# Patient Record
Sex: Female | Born: 1950
Health system: Southern US, Community
[De-identification: ages and names within clinical notes are randomized; demographics above are authoritative.]

## PROBLEM LIST (undated history)

## (undated) DIAGNOSIS — E119 Type 2 diabetes mellitus without complications: Secondary | ICD-10-CM

## (undated) DIAGNOSIS — K219 Gastro-esophageal reflux disease without esophagitis: Secondary | ICD-10-CM

## (undated) DIAGNOSIS — G2581 Restless legs syndrome: Secondary | ICD-10-CM

## (undated) DIAGNOSIS — R531 Weakness: Secondary | ICD-10-CM

## (undated) DIAGNOSIS — F32A Depression, unspecified: Secondary | ICD-10-CM

## (undated) DIAGNOSIS — I10 Essential (primary) hypertension: Secondary | ICD-10-CM

## (undated) DIAGNOSIS — Z8489 Family history of other specified conditions: Secondary | ICD-10-CM

## (undated) DIAGNOSIS — E785 Hyperlipidemia, unspecified: Secondary | ICD-10-CM

## (undated) DIAGNOSIS — I639 Cerebral infarction, unspecified: Secondary | ICD-10-CM

## (undated) HISTORY — DX: Cerebral infarction, unspecified: I63.9

## (undated) HISTORY — PX: TONSILLECTOMY: SUR1361

## (undated) HISTORY — PX: OTHER SURGICAL HISTORY: SHX169

## (undated) HISTORY — DX: Type 2 diabetes mellitus without complications: E11.9

## (undated) HISTORY — PX: ABDOMINAL HYSTERECTOMY: SHX81

## (undated) HISTORY — DX: Depression, unspecified: F32.A

## (undated) HISTORY — DX: Gastro-esophageal reflux disease without esophagitis: K21.9

## (undated) HISTORY — DX: Hyperlipidemia, unspecified: E78.5

## (undated) HISTORY — PX: CARPAL TUNNEL RELEASE: SHX101

## (undated) HISTORY — DX: Weakness: R53.1

## (undated) HISTORY — PX: ACHILLES TENDON REPAIR: SUR1153

## (undated) HISTORY — DX: Restless legs syndrome: G25.81

## (undated) HISTORY — PX: CHOLECYSTECTOMY: SHX55

## (undated) HISTORY — DX: Essential (primary) hypertension: I10

---

## 2020-05-24 ENCOUNTER — Other Ambulatory Visit: Payer: Self-pay

## 2020-05-24 ENCOUNTER — Encounter: Payer: Self-pay | Admitting: Podiatry

## 2020-05-24 ENCOUNTER — Ambulatory Visit: Payer: Medicare HMO | Admitting: Podiatry

## 2020-05-24 ENCOUNTER — Ambulatory Visit (INDEPENDENT_AMBULATORY_CARE_PROVIDER_SITE_OTHER): Payer: Medicare HMO

## 2020-05-24 DIAGNOSIS — M79675 Pain in left toe(s): Secondary | ICD-10-CM | POA: Diagnosis not present

## 2020-05-24 DIAGNOSIS — M79674 Pain in right toe(s): Secondary | ICD-10-CM | POA: Diagnosis not present

## 2020-05-24 DIAGNOSIS — B351 Tinea unguium: Secondary | ICD-10-CM | POA: Diagnosis not present

## 2020-05-24 DIAGNOSIS — M6740 Ganglion, unspecified site: Secondary | ICD-10-CM

## 2020-05-24 DIAGNOSIS — E119 Type 2 diabetes mellitus without complications: Secondary | ICD-10-CM

## 2020-05-24 NOTE — Patient Instructions (Addendum)
Diabetes Mellitus and Foot Care Foot care is an important part of your health, especially when you have diabetes. Diabetes may cause you to have problems because of poor blood flow (circulation) to your feet and legs, which can cause your skin to:  Become thinner and drier.  Break more easily.  Heal more slowly.  Peel and crack. You may also have nerve damage (neuropathy) in your legs and feet, causing decreased feeling in them. This means that you may not notice minor injuries to your feet that could lead to more serious problems. Noticing and addressing any potential problems early is the best way to prevent future foot problems. How to care for your feet Foot hygiene  Wash your feet daily with warm water and mild soap. Do not use hot water. Then, pat your feet and the areas between your toes until they are completely dry. Do not soak your feet as this can dry your skin.  Trim your toenails straight across. Do not dig under them or around the cuticle. File the edges of your nails with an emery board or nail file.  Apply a moisturizing lotion or petroleum jelly to the skin on your feet and to dry, brittle toenails. Use lotion that does not contain alcohol and is unscented. Do not apply lotion between your toes. Shoes and socks  Wear clean socks or stockings every day. Make sure they are not too tight. Do not wear knee-high stockings since they may decrease blood flow to your legs.  Wear shoes that fit properly and have enough cushioning. Always look in your shoes before you put them on to be sure there are no objects inside.  To break in new shoes, wear them for just a few hours a day. This prevents injuries on your feet. Wounds, scrapes, corns, and calluses  Check your feet daily for blisters, cuts, bruises, sores, and redness. If you cannot see the bottom of your feet, use a mirror or ask someone for help.  Do not cut corns or calluses or try to remove them with medicine.  If you  find a minor scrape, cut, or break in the skin on your feet, keep it and the skin around it clean and dry. You may clean these areas with mild soap and water. Do not clean the area with peroxide, alcohol, or iodine.  If you have a wound, scrape, corn, or callus on your foot, look at it several times a day to make sure it is healing and not infected. Check for: ? Redness, swelling, or pain. ? Fluid or blood. ? Warmth. ? Pus or a bad smell. General instructions  Do not cross your legs. This may decrease blood flow to your feet.  Do not use heating pads or hot water bottles on your feet. They may burn your skin. If you have lost feeling in your feet or legs, you may not know this is happening until it is too late.  Protect your feet from hot and cold by wearing shoes, such as at the beach or on hot pavement.  Schedule a complete foot exam at least once a year (annually) or more often if you have foot problems. If you have foot problems, report any cuts, sores, or bruises to your health care provider immediately. Contact a health care provider if:  You have a medical condition that increases your risk of infection and you have any cuts, sores, or bruises on your feet.  You have an injury that is not   healing.  You have redness on your legs or feet.  You feel burning or tingling in your legs or feet.  You have pain or cramps in your legs and feet.  Your legs or feet are numb.  Your feet always feel cold.  You have pain around a toenail. Get help right away if:  You have a wound, scrape, corn, or callus on your foot and: ? You have pain, swelling, or redness that gets worse. ? You have fluid or blood coming from the wound, scrape, corn, or callus. ? Your wound, scrape, corn, or callus feels warm to the touch. ? You have pus or a bad smell coming from the wound, scrape, corn, or callus. ? You have a fever. ? You have a red line going up your leg. Summary  Check your feet every day  for cuts, sores, red spots, swelling, and blisters.  Moisturize feet and legs daily.  Wear shoes that fit properly and have enough cushioning.  If you have foot problems, report any cuts, sores, or bruises to your health care provider immediately.  Schedule a complete foot exam at least once a year (annually) or more often if you have foot problems. This information is not intended to replace advice given to you by your health care provider. Make sure you discuss any questions you have with your health care provider. Document Revised: 06/29/2019 Document Reviewed: 11/07/2016 Elsevier Patient Education  Kimberly Toe  Hammer toe is a change in the shape (a deformity) of your toe. The deformity causes the middle joint of your toe to stay bent. This causes pain, especially when you are wearing shoes. Hammer toe starts gradually. At first, the toe can be straightened. Gradually over time, the deformity becomes stiff and permanent. Early treatments to keep the toe straight may relieve pain. As the deformity becomes stiff and permanent, surgery may be needed to straighten the toe. What are the causes? Hammer toe is caused by abnormal bending of the toe joint that is closest to your foot. It happens gradually over time. This pulls on the muscles and connections (tendons) of the toe joint, making them weak and stiff. It is often related to wearing shoes that are too short or narrow and do not let your toes straighten. What increases the risk? You may be at greater risk for hammer toe if you:  Are female.  Are older.  Wear shoes that are too small.  Wear high-heeled shoes that pinch your toes.  Are a Engineer, mining.  Have a second toe that is longer than your big toe (first toe).  Injure your foot or toe.  Have arthritis.  Have a family history of hammer toe.  Have a nerve or muscle disorder. What are the signs or symptoms? The main symptoms of this condition are  pain and deformity of the toe. The pain is worse when wearing shoes, walking, or running. Other symptoms may include:  Corns or calluses over the bent part of the toe or between the toes.  Redness and a burning feeling on the toe.  An open sore that forms on the top of the toe.  Not being able to straighten the toe. How is this diagnosed? This condition is diagnosed based on your symptoms and a physical exam. During the exam, your health care provider will try to straighten your toe to see how stiff the deformity is. You may also have tests, such as:  A blood test  to check for rheumatoid arthritis.  An X-ray to show how severe the deformity is. How is this treated? Treatment for this condition will depend on how stiff the deformity is. Surgery is often needed. However, sometimes a hammer toe can be straightened without surgery. Treatments that do not involve surgery include:  Taping the toe into a straightened position.  Using pads and cushions to protect the toe (orthotics).  Wearing shoes that provide enough room for the toes.  Doing toe-stretching exercises at home.  Taking an NSAID to reduce pain and swelling. If these treatments do not help or the toe cannot be straightened, surgery is the next option. The most common surgeries used to straighten a hammer toe include:  Arthroplasty. In this procedure, part of the joint is removed, and that allows the toe to straighten.  Fusion. In this procedure, cartilage between the two bones of the joint is taken out and the bones are fused together into one longer bone.  Implantation. In this procedure, part of the bone is removed and replaced with an implant to let the toe move again.  Flexor tendon transfer. In this procedure, the tendons that curl the toes down (flexor tendons) are repositioned. Follow these instructions at home:  Take over-the-counter and prescription medicines only as told by your health care provider.  Do toe  straightening and stretching exercises as told by your health care provider.  Keep all follow-up visits as told by your health care provider. This is important. How is this prevented?  Wear shoes that give your toes enough room and do not cause pain.  Do not wear high-heeled shoes. Contact a health care provider if:  Your pain gets worse.  Your toe becomes red or swollen.  You develop an open sore on your toe. This information is not intended to replace advice given to you by your health care provider. Make sure you discuss any questions you have with your health care provider. Document Revised: 09/18/2017 Document Reviewed: 01/30/2016 Elsevier Patient Education  2020 [employer].

## 2020-05-26 ENCOUNTER — Encounter: Payer: Self-pay | Admitting: Podiatry

## 2020-05-26 NOTE — Progress Notes (Signed)
Subjective: Linda Mathis presents today referred by [name], MD for diabetic foot evaluation. Last visit with Dr. [name] was 05/23/2020.  Patient relates 6 year history of diabetes.  Patient relates surgical h/o Achilles tendon repair LLE with postop wound complication which required wound care from home health.  Patient denies any history of numbness, tingling, burning, pins/needles sensations.  Today, patient c/o of painful, discolored, thick toenails which interfere with daily activities.  Pain is aggravated when wearing enclosed shoe gear.   Past Medical History:  Diagnosis Date  . CVA (cerebral vascular accident) (HCC)   . Depression   . Diabetes (HCC)   . GERD (gastroesophageal reflux disease)   . HTN (hypertension)   . Hyperlipidemia   . Left-sided weakness   . RLS (restless legs syndrome)     There are no problems to display for this patient.   Past Surgical History:  Procedure Laterality Date  . ABDOMINAL HYSTERECTOMY    . ACHILLES TENDON REPAIR     complication of postop wound requiring home health wound care  . CARPAL TUNNEL RELEASE Right   . CHOLECYSTECTOMY    . L4-L5 with Coflex device    . partial nail avulsion medial border hallux Left   . TONSILLECTOMY      Current Outpatient Medications on File Prior to Visit  Medication Sig Dispense Refill  . amLODipine-benazepril (LOTREL) 5-20 MG capsule Take 1 capsule by mouth daily.    . cyclobenzaprine (FLEXERIL) 10 MG tablet Take 10 mg by mouth 2 (two) times daily as needed.    Marland Kitchen FLUoxetine (PROZAC) 40 MG capsule Take 40 mg by mouth daily.    Marland Kitchen gabapentin (NEURONTIN) 300 MG capsule Take 300 mg by mouth 3 (three) times daily.    Marland Kitchen ofloxacin (OCUFLOX) 0.3 % ophthalmic solution     . omeprazole (PRILOSEC) 20 MG capsule Take 20 mg by mouth daily.    . prednisoLONE acetate (PRED FORTE) 1 % ophthalmic suspension     . rOPINIRole (REQUIP) 3 MG tablet Take 3 mg by mouth at bedtime.    . rosuvastatin (CRESTOR) 20 MG  tablet Take 20 mg by mouth at bedtime.    . traMADol (ULTRAM) 50 MG tablet Take 50 mg by mouth 4 (four) times daily as needed.    . traZODone (DESYREL) 100 MG tablet     . trazodone (DESYREL) 300 MG tablet Take 300 mg by mouth at bedtime.     No current facility-administered medications on file prior to visit.     No Known Allergies  Social History   Occupational History  . Not on file  Tobacco Use  . Smoking status: Current Every Day Smoker  . Smokeless tobacco: Never Used  Substance and Sexual Activity  . Alcohol use: Not on file  . Drug use: Not on file  . Sexual activity: Not on file    History reviewed. No pertinent family history.   There is no immunization history on file for this patient.  Review of systems: Positive Findings in bold print.  Constitutional:  chills, fatigue, fever, sweats, weight change Communication: Nurse, learning disability, sign Presenter, broadcasting, hand writing, iPad/Android device Head: headaches, head injury Eyes: changes in vision, eye pain, glaucoma, cataracts, macular degeneration, diplopia, glare,  light sensitivity, eyeglasses or contacts, blindness Ears nose mouth throat: hearing impaired, hearing aids,  ringing in ears, deaf, sign language,  vertigo, nosebleeds,  rhinitis,  cold sores, snoring, swollen glands Cardiovascular: HTN, edema, arrhythmia, pacemaker in place, defibrillator in place, chest pain/tightness,  chronic anticoagulation, blood clot, heart failure, MI Peripheral Vascular: leg cramps, varicose veins, blood clots, lymphedema, varicosities Respiratory:  difficulty breathing, denies congestion, SOB, wheezing, cough, emphysema Gastrointestinal: change in appetite or weight, abdominal pain, constipation, diarrhea, nausea, vomiting, vomiting blood, change in bowel habits, abdominal pain, jaundice, rectal bleeding, hemorrhoids, GERD Genitourinary:  nocturia,  pain on urination, polyuria,  blood in urine, Foley catheter, urinary urgency, ESRD on  hemodialysis Musculoskeletal: amputation, cramping, stiff joints, painful joints, decreased joint motion, fractures, OA, gout, hemiplegia, paraplegia, uses cane, wheelchair bound, uses walker, uses rollator Skin: changes in toenails, color change, dryness, itching, mole changes,  rash, wound(s) Neurological: headaches, numbness in feet, paresthesias in feet, burning in feet, fainting,  seizures, change in speech,  headaches, memory problems/poor historian, cerebral palsy, weakness, paralysis, CVA, TIA Endocrine: diabetes, hypothyroidism, hyperthyroidism,  goiter, dry mouth, flushing, heat intolerance,  cold intolerance,  excessive thirst, denies polyuria,  nocturia Hematological:  easy bleeding, excessive bleeding, easy bruising, enlarged lymph nodes, on long term blood thinner, history of past transusions Allergy/immunological:  hives, eczema, frequent infections, multiple drug allergies, seasonal allergies, transplant recipient, multiple food allergies Psychiatric:  anxiety, depression, mood disorder, suicidal ideations, hallucinations, insomnia  Objective: There were no vitals filed for this visit.  Linda Mathis is a pleasant 69 y.o. Caucasian female in NAD. AAO X 3.  Vascular Examination: Capillary fill time to digits <3 seconds b/l lower extremities. Palpable pedal pulses b/l LE. Pedal hair sparse. Lower extremity skin temperature gradient within normal limits.  Dermatological Examination: Pedal skin with normal turgor, texture and tone bilaterally. No open wounds bilaterally. No interdigital macerations bilaterally. Toenails 1-5 b/l elongated, discolored, dystrophic, thickened, crumbly with subungual debris and tenderness to dorsal palpation. Fixed, firm, palpable mass noted proximal nail border left hallux.  Musculoskeletal Examination: Normal muscle strength 5/5 to all lower extremity muscle groups bilaterally. No pain crepitus or joint limitation noted with ROM b/l. Hallux valgus  with bunion deformity noted b/l lower extremities.  Footwear Assessment: Does the patient wear appropriate shoes? Yes. Does the patient need inserts/orthotics? No.  Neurological Examination: Protective sensation intact 5/5 intact bilaterally with 10g monofilament b/l. Vibratory sensation intact b/l.  Xray left foot: No evidence of fracture Normal bone mineralization Osteal reaction of distal phalanx at location of cyst  Assessment: 1. Ganglion cyst   2. Pain due to onychomycosis of toenails of both feet   3. Controlled type 2 diabetes mellitus without complication, without long-term current use of insulin (HCC)   4. Encounter for diabetic foot exam (HCC)      ADA Foot Risk Categorization: Low Risk:  Patient has all of the following: Intact protective sensation No prior foot ulcer  No severe deformity Pedal pulses present  Plan: -Examined patient. -Diabetic foot examination performed on today's visit. -Toenails 1-5 b/l were debrided in length and girth with sterile nail nippers and dremel without iatrogenic bleeding.  -Patient to report any pedal injuries to medical professional immediately. -Xray of left foot was performed and reviewed with patient and/or POA. -Patient referred for Dr.Stover for surgical consultation regarding cyst left hallux. -Patient to continue soft, supportive shoe gear daily. -Patient/POA to call should there be question/concern in the interim.  Return in about 3 months (around 08/24/2020).

## 2020-06-26 ENCOUNTER — Other Ambulatory Visit: Payer: Self-pay

## 2020-06-26 ENCOUNTER — Encounter: Payer: Self-pay | Admitting: Sports Medicine

## 2020-06-26 ENCOUNTER — Ambulatory Visit: Payer: Medicare HMO | Admitting: Sports Medicine

## 2020-06-26 DIAGNOSIS — M19079 Primary osteoarthritis, unspecified ankle and foot: Secondary | ICD-10-CM

## 2020-06-26 DIAGNOSIS — M79675 Pain in left toe(s): Secondary | ICD-10-CM | POA: Diagnosis not present

## 2020-06-26 DIAGNOSIS — M67472 Ganglion, left ankle and foot: Secondary | ICD-10-CM

## 2020-06-26 DIAGNOSIS — E119 Type 2 diabetes mellitus without complications: Secondary | ICD-10-CM | POA: Diagnosis not present

## 2020-06-26 DIAGNOSIS — M6740 Ganglion, unspecified site: Secondary | ICD-10-CM

## 2020-06-26 NOTE — Progress Notes (Signed)
Subjective: Linda Mathis is a 69 y.o. female patient who presents to office for evaluation of left first toe pain.  Patient reports that there is no change to the left great toe reports that there is some tenderness with touch however no pain with shoe or sock.  Patient denies injury/trip/fall/sprain/any causative factors.   Patient is diabetic with last blood sugar not recorded.  There are no problems to display for this patient.   Current Outpatient Medications on File Prior to Visit  Medication Sig Dispense Refill  . amLODipine-benazepril (LOTREL) 5-20 MG capsule Take 1 capsule by mouth daily.    . cyclobenzaprine (FLEXERIL) 10 MG tablet Take 10 mg by mouth 2 (two) times daily as needed.    Marland Kitchen FLUoxetine (PROZAC) 40 MG capsule Take 40 mg by mouth daily.    Marland Kitchen gabapentin (NEURONTIN) 300 MG capsule Take 300 mg by mouth 3 (three) times daily.    Marland Kitchen ofloxacin (OCUFLOX) 0.3 % ophthalmic solution     . omeprazole (PRILOSEC) 20 MG capsule Take 20 mg by mouth daily.    . prednisoLONE acetate (PRED FORTE) 1 % ophthalmic suspension     . rOPINIRole (REQUIP) 3 MG tablet Take 3 mg by mouth at bedtime.    . rosuvastatin (CRESTOR) 20 MG tablet Take 20 mg by mouth at bedtime.    . traMADol (ULTRAM) 50 MG tablet Take 50 mg by mouth 4 (four) times daily as needed.    . traZODone (DESYREL) 100 MG tablet     . trazodone (DESYREL) 300 MG tablet Take 300 mg by mouth at bedtime.     No current facility-administered medications on file prior to visit.    No Known Allergies  Objective:  General: Alert and oriented x3 in no acute distress  Dermatology: Small raised soft tissue mass at the left hallux proximal medial nail fold likely comparable to cyst no open lesions bilateral lower extremities, no webspace macerations, no ecchymosis bilateral, all nails x 10 are short and thickened but well manicured.  Vascular: Dorsalis Pedis and Posterior Tibial pedal pulses palpable, Capillary Fill Time 3 seconds,  scant pedal hair growth bilateral, no edema bilateral lower extremities, Temperature gradient within normal limits.  Neurology: Michaell Cowing sensation intact via light touch bilateral.  Musculoskeletal: Mild tenderness with palpation at left hallux distal and proximal interphalangeal joint with most pain at the area of the soft tissue mass which measures 0.5 x 0.3 cm at the proximal left medial hallux nail fold with no surrounding signs of infection as listed above.  Assessment and Plan: Problem List Items Addressed This Visit    None    Visit Diagnoses    Ganglion cyst    -  Primary   Arthritis of big toe       Pain of left great toe       Controlled type 2 diabetes mellitus without complication, without long-term current use of insulin (HCC)           -Complete examination performed -Xrays reviewed from her last office visit consistent with arthritis -Discussed treatement options for likely cyst with underlying arthritis -Patient states that she does not want any surgery at this time wants to wait to after the first of the year if things progress or fail to continue to improve -At this time for symptomatic relief after verbal consent injected 0.5 cc of Kenalog 10 and 1 cc of lidocaine to the left hallux proximal nail fold at the area of cyst and then attempted aspiration  where there was only 1 drop of clear drainage noted from the cyst area and then dressed area with Band-Aid and advised patient that if there is any redness to the area may reapply Band-Aid and antibiotic cream. -Encourage good supportive shoes that do not rub or irritate her toe -Patient to return to office after 1 month for recheck or sooner if condition worsens.  Asencion Islam, DPM

## 2020-07-17 DIAGNOSIS — R079 Chest pain, unspecified: Secondary | ICD-10-CM | POA: Diagnosis not present

## 2020-07-24 ENCOUNTER — Ambulatory Visit (INDEPENDENT_AMBULATORY_CARE_PROVIDER_SITE_OTHER): Payer: Medicare HMO | Admitting: Sports Medicine

## 2020-07-24 ENCOUNTER — Encounter: Payer: Self-pay | Admitting: Sports Medicine

## 2020-07-24 ENCOUNTER — Other Ambulatory Visit: Payer: Self-pay

## 2020-07-24 DIAGNOSIS — M79675 Pain in left toe(s): Secondary | ICD-10-CM

## 2020-07-24 DIAGNOSIS — M19079 Primary osteoarthritis, unspecified ankle and foot: Secondary | ICD-10-CM | POA: Diagnosis not present

## 2020-07-24 DIAGNOSIS — M6740 Ganglion, unspecified site: Secondary | ICD-10-CM

## 2020-07-24 DIAGNOSIS — E119 Type 2 diabetes mellitus without complications: Secondary | ICD-10-CM | POA: Diagnosis not present

## 2020-07-24 NOTE — Progress Notes (Signed)
Subjective: Linda Mathis is a 69 y.o. female patient who returns to office for evaluation of left first toe pain.  Patient reports that there is no pain.  Is doing pretty good at the left big toe.  Denies any other pedal complaints at this time.  Patient is diabetic with last blood sugar not recorded.  There are no problems to display for this patient.   Current Outpatient Medications on File Prior to Visit  Medication Sig Dispense Refill  . amLODipine-benazepril (LOTREL) 5-20 MG capsule Take 1 capsule by mouth daily.    . cyclobenzaprine (FLEXERIL) 10 MG tablet Take 10 mg by mouth 2 (two) times daily as needed.    Marland Kitchen FLUoxetine (PROZAC) 40 MG capsule Take 40 mg by mouth daily.    Marland Kitchen gabapentin (NEURONTIN) 300 MG capsule Take 300 mg by mouth 3 (three) times daily.    Marland Kitchen ofloxacin (OCUFLOX) 0.3 % ophthalmic solution     . omeprazole (PRILOSEC) 20 MG capsule Take 20 mg by mouth daily.    . prednisoLONE acetate (PRED FORTE) 1 % ophthalmic suspension     . rOPINIRole (REQUIP) 3 MG tablet Take 3 mg by mouth at bedtime.    . rosuvastatin (CRESTOR) 20 MG tablet Take 20 mg by mouth at bedtime.    . traMADol (ULTRAM) 50 MG tablet Take 50 mg by mouth 4 (four) times daily as needed.    . traZODone (DESYREL) 100 MG tablet     . trazodone (DESYREL) 300 MG tablet Take 300 mg by mouth at bedtime.     No current facility-administered medications on file prior to visit.    No Known Allergies  Objective:  General: Alert and oriented x3 in no acute distress  Dermatology: Decreased raised soft tissue mass at the left hallux proximal medial nail fold likely comparable to cyst no open lesions bilateral lower extremities, no webspace macerations, no ecchymosis bilateral, all nails x 10 are thickened and mildly elongated.  Vascular: Dorsalis Pedis and Posterior Tibial pedal pulses palpable, Capillary Fill Time 3 seconds, scant pedal hair growth bilateral, no edema bilateral lower extremities, Temperature  gradient within normal limits.  Neurology: Michaell Cowing sensation intact via light touch bilateral.  Musculoskeletal: No reproducible tenderness to the left distal hallux or proximal interphalangeal joint at area of previous pain at area of cyst.  There are no surrounding signs of infection as listed above.  Hallux hammertoe left greater than right.  Assessment and Plan: Problem List Items Addressed This Visit    None    Visit Diagnoses    Ganglion cyst    -  Primary   Arthritis of big toe       Pain of left great toe       Controlled type 2 diabetes mellitus without complication, without long-term current use of insulin (HCC)           -Complete examination performed -Advised patient since this area and pain at left great toe is resolved we will closely monitor at this time -Dispensed toe cap and encouraged good supportive shoes that do not rub or irritate her toe -And no additional charge mechanically debrided nails x10 using a sterile nail nipper and filed with trim all patient to return for routine foot care as scheduled next visit  Asencion Islam, DPM

## 2020-08-30 ENCOUNTER — Ambulatory Visit: Payer: Medicare HMO | Admitting: Podiatry

## 2020-09-27 ENCOUNTER — Ambulatory Visit: Payer: Medicare HMO | Admitting: Podiatry

## 2020-09-27 ENCOUNTER — Encounter: Payer: Self-pay | Admitting: Podiatry

## 2020-09-27 ENCOUNTER — Other Ambulatory Visit: Payer: Self-pay

## 2020-09-27 DIAGNOSIS — E119 Type 2 diabetes mellitus without complications: Secondary | ICD-10-CM | POA: Diagnosis not present

## 2020-09-27 DIAGNOSIS — M79674 Pain in right toe(s): Secondary | ICD-10-CM | POA: Diagnosis not present

## 2020-09-27 DIAGNOSIS — B351 Tinea unguium: Secondary | ICD-10-CM

## 2020-09-27 DIAGNOSIS — M79675 Pain in left toe(s): Secondary | ICD-10-CM | POA: Diagnosis not present

## 2020-10-02 NOTE — Progress Notes (Signed)
Subjective:  Patient ID: Linda Mathis, female    DOB: Feb 02, 1951,  MRN: [id number]  69 y.o. female presents with preventative diabetic foot care and painful thick toenails that are difficult to trim. Pain interferes with ambulation. Aggravating factors include wearing enclosed shoe gear. Pain is relieved with periodic professional debridement..    She has seen Dr. Marylene Land for ganglion cyst of left hallux.  PCP: [name], MD and last visit was: two weeks ago per patient.  She states she is going to [facility] in January for her birthday. She is also having some hip pain and has appointment scheduled with her pain doctor.  Review of Systems: Negative except as noted in the HPI.  Past Medical History:  Diagnosis Date  . CVA (cerebral vascular accident) (HCC)   . Depression   . Diabetes (HCC)   . GERD (gastroesophageal reflux disease)   . HTN (hypertension)   . Hyperlipidemia   . Left-sided weakness   . RLS (restless legs syndrome)    Past Surgical History:  Procedure Laterality Date  . ABDOMINAL HYSTERECTOMY    . ACHILLES TENDON REPAIR     complication of postop wound requiring home health wound care  . CARPAL TUNNEL RELEASE Right   . CHOLECYSTECTOMY    . L4-L5 with Coflex device    . partial nail avulsion medial border hallux Left   . TONSILLECTOMY     There are no problems to display for this patient.   Current Outpatient Medications:  .  amLODipine-benazepril (LOTREL) 5-20 MG capsule, Take 1 capsule by mouth daily., Disp: , Rfl:  .  cyclobenzaprine (FLEXERIL) 10 MG tablet, Take 10 mg by mouth 2 (two) times daily as needed., Disp: , Rfl:  .  FLUoxetine (PROZAC) 40 MG capsule, Take 40 mg by mouth daily., Disp: , Rfl:  .  gabapentin (NEURONTIN) 300 MG capsule, Take 300 mg by mouth 3 (three) times daily., Disp: , Rfl:  .  ofloxacin (OCUFLOX) 0.3 % ophthalmic solution, , Disp: , Rfl:  .  omeprazole (PRILOSEC) 20 MG capsule, Take 20 mg by mouth daily., Disp: , Rfl:  .   omeprazole (PRILOSEC) 40 MG capsule, Take 40 mg by mouth daily., Disp: , Rfl:  .  prednisoLONE acetate (PRED FORTE) 1 % ophthalmic suspension, , Disp: , Rfl:  .  rOPINIRole (REQUIP) 3 MG tablet, Take 3 mg by mouth at bedtime., Disp: , Rfl:  .  rosuvastatin (CRESTOR) 20 MG tablet, Take 20 mg by mouth at bedtime., Disp: , Rfl:  .  traMADol (ULTRAM) 50 MG tablet, Take 50 mg by mouth 4 (four) times daily as needed., Disp: , Rfl:  .  traZODone (DESYREL) 100 MG tablet, , Disp: , Rfl:  .  trazodone (DESYREL) 300 MG tablet, Take 300 mg by mouth at bedtime., Disp: , Rfl:  .  valACYclovir (VALTREX) 1000 MG tablet, Take 4,000 mg by mouth once., Disp: , Rfl:  No Known Allergies Social History   Tobacco Use  Smoking Status Current Every Day Smoker  Smokeless Tobacco Never Used    Objective:  There were no vitals filed for this visit. Constitutional Patient is a pleasant 69 y.o. Caucasian female in NAD. AAO x 3.  Vascular Capillary fill time to digits <3 seconds b/l lower extremities. Palpable pedal pulses b/l LE. Pedal hair sparse. Lower extremity skin temperature gradient within normal limits. No cyanosis or clubbing noted.  Neurologic Normal speech. Protective sensation intact 5/5 intact bilaterally with 10g monofilament b/l. Vibratory sensation intact b/l.  Dermatologic Pedal skin with normal turgor, texture and tone bilaterally. No open wounds bilaterally. No interdigital macerations bilaterally. Toenails 1-5 b/l elongated, discolored, dystrophic, thickened, crumbly with subungual debris and tenderness to dorsal palpation.  Orthopedic: Normal muscle strength 5/5 to all lower extremity muscle groups bilaterally. No pain crepitus or joint limitation noted with ROM b/l. Hallux valgus with bunion deformity noted b/l lower extremities.   No flowsheet data found.     Assessment:   1. Pain due to onychomycosis of toenails of both feet   2. Controlled type 2 diabetes mellitus without complication,  without long-term current use of insulin (HCC)    Plan:  Patient was evaluated and treated and all questions answered.  Onychomycosis with pain -Nails palliatively debridement as below. -Educated on self-care  Procedure: Nail Debridement Rationale: Pain Type of Debridement: manual, sharp debridement. Instrumentation: Nail nipper, rotary burr. Number of Nails: 10  -Examined patient. -No new findings. No new orders. -Continue diabetic foot care principles. -Patient to continue soft, supportive shoe gear daily. -Toenails 1-5 b/l were debrided in length and girth with sterile nail nippers and dremel without iatrogenic bleeding.  -Patient to report any pedal injuries to medical professional immediately. -Patient/POA to call should there be question/concern in the interim.  Return in about 3 months (around 12/26/2020) for painful mycotic toenails.  Freddie Breech, DPM

## 2020-11-21 DIAGNOSIS — S81802A Unspecified open wound, left lower leg, initial encounter: Secondary | ICD-10-CM

## 2020-11-21 HISTORY — DX: Unspecified open wound, left lower leg, initial encounter: S81.802A

## 2020-11-23 ENCOUNTER — Inpatient Hospital Stay (HOSPITAL_COMMUNITY)
Admission: EM | Admit: 2020-11-23 | Discharge: 2020-11-28 | DRG: 603 | Disposition: A | Discharge status: Home / Self Care | Payer: Medicare HMO | Attending: Internal Medicine | Admitting: Internal Medicine

## 2020-11-23 ENCOUNTER — Encounter (HOSPITAL_COMMUNITY): Payer: Self-pay | Admitting: Emergency Medicine

## 2020-11-23 DIAGNOSIS — Z8249 Family history of ischemic heart disease and other diseases of the circulatory system: Secondary | ICD-10-CM

## 2020-11-23 DIAGNOSIS — G2581 Restless legs syndrome: Secondary | ICD-10-CM | POA: Diagnosis present

## 2020-11-23 DIAGNOSIS — I252 Old myocardial infarction: Secondary | ICD-10-CM

## 2020-11-23 DIAGNOSIS — E785 Hyperlipidemia, unspecified: Secondary | ICD-10-CM | POA: Diagnosis present

## 2020-11-23 DIAGNOSIS — S81812A Laceration without foreign body, left lower leg, initial encounter: Secondary | ICD-10-CM | POA: Diagnosis present

## 2020-11-23 DIAGNOSIS — L304 Erythema intertrigo: Secondary | ICD-10-CM | POA: Diagnosis present

## 2020-11-23 DIAGNOSIS — L089 Local infection of the skin and subcutaneous tissue, unspecified: Secondary | ICD-10-CM

## 2020-11-23 DIAGNOSIS — K219 Gastro-esophageal reflux disease without esophagitis: Secondary | ICD-10-CM | POA: Diagnosis present

## 2020-11-23 DIAGNOSIS — Z6841 Body Mass Index (BMI) 40.0 and over, adult: Secondary | ICD-10-CM

## 2020-11-23 DIAGNOSIS — R011 Cardiac murmur, unspecified: Secondary | ICD-10-CM | POA: Diagnosis present

## 2020-11-23 DIAGNOSIS — L03116 Cellulitis of left lower limb: Secondary | ICD-10-CM | POA: Diagnosis not present

## 2020-11-23 DIAGNOSIS — Z20822 Contact with and (suspected) exposure to covid-19: Secondary | ICD-10-CM | POA: Diagnosis present

## 2020-11-23 DIAGNOSIS — Z66 Do not resuscitate: Secondary | ICD-10-CM | POA: Diagnosis present

## 2020-11-23 DIAGNOSIS — E1151 Type 2 diabetes mellitus with diabetic peripheral angiopathy without gangrene: Secondary | ICD-10-CM | POA: Diagnosis present

## 2020-11-23 DIAGNOSIS — Z803 Family history of malignant neoplasm of breast: Secondary | ICD-10-CM

## 2020-11-23 DIAGNOSIS — T148XXA Other injury of unspecified body region, initial encounter: Secondary | ICD-10-CM | POA: Diagnosis not present

## 2020-11-23 DIAGNOSIS — Z79899 Other long term (current) drug therapy: Secondary | ICD-10-CM

## 2020-11-23 DIAGNOSIS — F17200 Nicotine dependence, unspecified, uncomplicated: Secondary | ICD-10-CM | POA: Diagnosis present

## 2020-11-23 DIAGNOSIS — I10 Essential (primary) hypertension: Secondary | ICD-10-CM | POA: Diagnosis present

## 2020-11-23 DIAGNOSIS — I951 Orthostatic hypotension: Secondary | ICD-10-CM | POA: Diagnosis present

## 2020-11-23 DIAGNOSIS — F32A Depression, unspecified: Secondary | ICD-10-CM | POA: Diagnosis present

## 2020-11-23 DIAGNOSIS — Z833 Family history of diabetes mellitus: Secondary | ICD-10-CM

## 2020-11-23 DIAGNOSIS — I69354 Hemiplegia and hemiparesis following cerebral infarction affecting left non-dominant side: Secondary | ICD-10-CM

## 2020-11-23 DIAGNOSIS — E669 Obesity, unspecified: Secondary | ICD-10-CM | POA: Diagnosis present

## 2020-11-23 DIAGNOSIS — S81802A Unspecified open wound, left lower leg, initial encounter: Secondary | ICD-10-CM | POA: Diagnosis present

## 2020-11-23 HISTORY — DX: Family history of other specified conditions: Z84.89

## 2020-11-23 LAB — CBC WITH DIFFERENTIAL/PLATELET
Abs Immature Granulocytes: 0.04 10*3/uL (ref 0.00–0.07)
Basophils Absolute: 0 10*3/uL (ref 0.0–0.1)
Basophils Relative: 0 %
Eosinophils Absolute: 0.1 10*3/uL (ref 0.0–0.5)
Eosinophils Relative: 1 %
HCT: 39.2 % (ref 36.0–46.0)
Hemoglobin: 12.3 g/dL (ref 12.0–15.0)
Immature Granulocytes: 1 %
Lymphocytes Relative: 17 %
Lymphs Abs: 1.3 10*3/uL (ref 0.7–4.0)
MCH: 27.6 pg (ref 26.0–34.0)
MCHC: 31.4 g/dL (ref 30.0–36.0)
MCV: 88.1 fL (ref 80.0–100.0)
Monocytes Absolute: 0.9 10*3/uL (ref 0.1–1.0)
Monocytes Relative: 12 %
Neutro Abs: 5.2 10*3/uL (ref 1.7–7.7)
Neutrophils Relative %: 69 %
Platelets: 222 10*3/uL (ref 150–400)
RBC: 4.45 MIL/uL (ref 3.87–5.11)
RDW: 15.7 % — ABNORMAL HIGH (ref 11.5–15.5)
WBC: 7.5 10*3/uL (ref 4.0–10.5)
nRBC: 0 % (ref 0.0–0.2)

## 2020-11-23 LAB — BASIC METABOLIC PANEL
Anion gap: 9 (ref 5–15)
BUN: 10 mg/dL (ref 8–23)
CO2: 26 mmol/L (ref 22–32)
Calcium: 8.9 mg/dL (ref 8.9–10.3)
Chloride: 101 mmol/L (ref 98–111)
Creatinine, Ser: 1.05 mg/dL — ABNORMAL HIGH (ref 0.44–1.00)
GFR, Estimated: 56 mL/min — ABNORMAL LOW (ref >60)
Glucose, Bld: 108 mg/dL — ABNORMAL HIGH (ref 70–99)
Potassium: 3.8 mmol/L (ref 3.5–5.1)
Sodium: 136 mmol/L (ref 135–145)

## 2020-11-23 MED ORDER — OXYCODONE-ACETAMINOPHEN 5-325 MG PO TABS
1.0000 | ORAL_TABLET | ORAL | Status: AC | PRN
  Administered 2020-11-23 – 2020-11-25 (×2): 1 via ORAL
  Filled 2020-11-23 (×2): qty 1

## 2020-11-23 MED ORDER — ONDANSETRON 4 MG PO TBDP
4.0000 mg | ORAL_TABLET | Freq: Once | ORAL | Status: AC
  Administered 2020-11-23: 4 mg via ORAL
  Filled 2020-11-23: qty 1

## 2020-11-23 NOTE — ED Notes (Signed)
Pt stated that her pain is a "12". Will informed Triage RN.

## 2020-11-23 NOTE — ED Triage Notes (Signed)
Pt states she fell Wednesday night causing skin tear to lateral left lower extremity, seen at [facility], skin steristripped together, pt now states MD feels like he is developing infection and needs wound care. Pt also reports she was not given pain control. Pt states she took tramadol x 2 this am with no relief.

## 2020-11-24 ENCOUNTER — Emergency Department (HOSPITAL_COMMUNITY): Payer: Medicare HMO

## 2020-11-24 ENCOUNTER — Observation Stay (HOSPITAL_COMMUNITY): Payer: Medicare HMO

## 2020-11-24 ENCOUNTER — Other Ambulatory Visit: Payer: Self-pay

## 2020-11-24 DIAGNOSIS — R011 Cardiac murmur, unspecified: Secondary | ICD-10-CM | POA: Diagnosis not present

## 2020-11-24 DIAGNOSIS — S81802A Unspecified open wound, left lower leg, initial encounter: Secondary | ICD-10-CM | POA: Diagnosis present

## 2020-11-24 LAB — LACTIC ACID, PLASMA: Lactic Acid, Venous: 1.8 mmol/L (ref 0.5–1.9)

## 2020-11-24 LAB — ECHOCARDIOGRAM COMPLETE
Area-P 1/2: 3.31 cm2
P 1/2 time: 334 msec
S' Lateral: 3.4 cm

## 2020-11-24 LAB — SARS CORONAVIRUS 2 (TAT 6-24 HRS): SARS Coronavirus 2: NEGATIVE

## 2020-11-24 LAB — CK: Total CK: 129 U/L (ref 38–234)

## 2020-11-24 MED ORDER — OXYCODONE HCL 5 MG PO TABS
5.0000 mg | ORAL_TABLET | ORAL | Status: DC | PRN
  Administered 2020-11-24 – 2020-11-28 (×10): 5 mg via ORAL
  Filled 2020-11-24 (×12): qty 1

## 2020-11-24 MED ORDER — ONDANSETRON HCL 4 MG/2ML IJ SOLN
4.0000 mg | Freq: Once | INTRAMUSCULAR | Status: AC
  Administered 2020-11-24: 4 mg via INTRAVENOUS
  Filled 2020-11-24: qty 2

## 2020-11-24 MED ORDER — SENNOSIDES-DOCUSATE SODIUM 8.6-50 MG PO TABS: 1.0000 | ORAL_TABLET | Freq: Every evening | ORAL | Status: DC | PRN

## 2020-11-24 MED ORDER — TRAMADOL HCL 50 MG PO TABS: 50.0000 mg | ORAL_TABLET | Freq: Four times a day (QID) | ORAL | Status: DC | PRN

## 2020-11-24 MED ORDER — NYSTATIN 100000 UNIT/GM EX POWD
Freq: Two times a day (BID) | CUTANEOUS | Status: DC
  Filled 2020-11-24 (×2): qty 15

## 2020-11-24 MED ORDER — FLUOXETINE HCL 20 MG PO CAPS
40.0000 mg | ORAL_CAPSULE | Freq: Every day | ORAL | Status: DC
  Administered 2020-11-24 – 2020-11-28 (×5): 40 mg via ORAL
  Filled 2020-11-24 (×6): qty 2

## 2020-11-24 MED ORDER — ROPINIROLE HCL 1 MG PO TABS
3.0000 mg | ORAL_TABLET | Freq: Every day | ORAL | Status: DC
  Administered 2020-11-24: 3 mg via ORAL
  Filled 2020-11-24 (×2): qty 3

## 2020-11-24 MED ORDER — OXYCODONE HCL 5 MG PO TABS: 5.0000 mg | ORAL_TABLET | ORAL | Status: DC | PRN

## 2020-11-24 MED ORDER — TRAZODONE HCL 50 MG PO TABS
150.0000 mg | ORAL_TABLET | Freq: Every day | ORAL | Status: DC
  Administered 2020-11-24 – 2020-11-27 (×4): 150 mg via ORAL
  Filled 2020-11-24 (×4): qty 1

## 2020-11-24 MED ORDER — ONDANSETRON HCL 4 MG/2ML IJ SOLN
4.0000 mg | Freq: Three times a day (TID) | INTRAMUSCULAR | Status: DC | PRN
  Administered 2020-11-24 – 2020-11-25 (×2): 4 mg via INTRAVENOUS
  Filled 2020-11-24 (×3): qty 2

## 2020-11-24 MED ORDER — PANTOPRAZOLE SODIUM 40 MG PO TBEC
40.0000 mg | DELAYED_RELEASE_TABLET | Freq: Every day | ORAL | Status: DC
  Administered 2020-11-24 – 2020-11-28 (×5): 40 mg via ORAL
  Filled 2020-11-24 (×5): qty 1

## 2020-11-24 MED ORDER — MORPHINE SULFATE (PF) 4 MG/ML IV SOLN
4.0000 mg | Freq: Once | INTRAVENOUS | Status: AC
Start: 2020-11-24 — End: 2020-11-24
  Administered 2020-11-24: 4 mg via INTRAVENOUS
  Filled 2020-11-24: qty 1

## 2020-11-24 MED ORDER — VANCOMYCIN HCL 2000 MG/400ML IV SOLN
2000.0000 mg | Freq: Once | INTRAVENOUS | Status: AC
  Administered 2020-11-24: 2000 mg via INTRAVENOUS
  Filled 2020-11-24: qty 400

## 2020-11-24 MED ORDER — ENOXAPARIN SODIUM 40 MG/0.4ML ~~LOC~~ SOLN
40.0000 mg | SUBCUTANEOUS | Status: DC
  Administered 2020-11-24 – 2020-11-27 (×4): 40 mg via SUBCUTANEOUS
  Filled 2020-11-24 (×4): qty 0.4

## 2020-11-24 MED ORDER — ROSUVASTATIN CALCIUM 20 MG PO TABS
20.0000 mg | ORAL_TABLET | Freq: Every day | ORAL | Status: DC
  Administered 2020-11-24 – 2020-11-27 (×4): 20 mg via ORAL
  Filled 2020-11-24 (×4): qty 1

## 2020-11-24 NOTE — Progress Notes (Signed)
  Echocardiogram 2D Echocardiogram has been performed.  Linda Mathis 11/24/2020, 3:07 PM

## 2020-11-24 NOTE — H&P (Addendum)
Date: 11/24/2020               Patient Name:  Linda Mathis MRN: [id number]  DOB: 1951-07-06 Age / Sex: 70 y.o., female   PCP: Linda Curia, MD         Medical Service: Internal Medicine Teaching Service         Attending Physician: Dr. Oswaldo Done, Marquita Palms, *    First Contact: Dr. Quincy Simmonds Pager: [phone]  Second Contact: Dr. Eliezer Bottom Pager: [phone]       After Hours (After 5p/  First Contact Pager: [phone]  weekends / holidays): Second Contact Pager: [phone]   Chief Complaint: Left lower extremity wound  History of Present Illness:  [name] is a 70 year old female with PMHx of hypertension, CAD, CVA with residual left sided weakness, restless leg syndrome, and depression presenting for evaluation of left lower extremity wound.  On Wednesday evening, patient notes mechanical fall at home against her bed rail causing a large skin tear. She presented to the ED in [place] an had 56 sutures placed. She was given Percocet and Keflex and instructed to follow up with her PCP. She was evaluated by her PCP yesterday and was instructed to present to ED for further evaluation for possible wound infection. She has noted clear drainage from wound but not blood. She has been taking tramadol for pain without improvement. Denies any fevers/chills, nausea/vomiting, chest pain or shortness of breath.  She notes that prior to the fall she got up too fast and became dizzy prior. She denies any further dizziness. However, does note that she has had dizziness on standing for several weeks. Denies any palpitations, orthopnea, chest pain or diaphoresis. Endorses good appetite. Denies having prior echo in the past. Notes in the last few weeks her left foot has been more swollen than usual but denies swelling of the right leg.   Meds:  Current Meds  Medication Sig   amLODipine-benazepril (LOTREL) 5-20 MG capsule Take 1 capsule by mouth daily.   cephALEXin (KEFLEX) 500 MG capsule Take  500 mg by mouth 4 (four) times daily. Course started on 11/22/2020, 10 day course, end 12/02/2020   FLUoxetine (PROZAC) 40 MG capsule Take 40 mg by mouth daily.   gabapentin (NEURONTIN) 300 MG capsule Take 300 mg by mouth 3 (three) times daily.   omeprazole (PRILOSEC) 40 MG capsule Take 40 mg by mouth daily.   rOPINIRole (REQUIP) 3 MG tablet Take 3 mg by mouth at bedtime.   rosuvastatin (CRESTOR) 20 MG tablet Take 20 mg by mouth at bedtime.   traMADol (ULTRAM) 50 MG tablet Take 50 mg by mouth 4 (four) times daily as needed.   trazodone (DESYREL) 300 MG tablet Take 300 mg by mouth at bedtime.    Allergies: Allergies as of 11/23/2020   (No Known Allergies)   Past Medical History:  Diagnosis Date   CVA (cerebral vascular accident) (HCC)    Depression    Diabetes (HCC)    GERD (gastroesophageal reflux disease)    HTN (hypertension)    Hyperlipidemia    Left-sided weakness    RLS (restless legs syndrome)    Past Surgical History:  Procedure Laterality Date   ABDOMINAL HYSTERECTOMY     ACHILLES TENDON REPAIR     complication of postop wound requiring home health wound care   CARPAL TUNNEL RELEASE Right    CHOLECYSTECTOMY     L4-L5 with Coflex device     partial  nail avulsion medial border hallux Left    TONSILLECTOMY      Family History:  Mother - hypertension, CAD, diabetes, breast cancer Brother - diabetes  Social History:  Patient is from [place] and moved to [place] 7 months ago to be closer to her daughter who lives in [place]. She has one daughter and one son. Her husband recently passed away in 02/27/2019.  Endorses prior history of tobacco use, smokes 4 cigarettes a day since age 31, Started smoking at age 45  Until age 42 and quit after having a stroke, unsure how much she smokes at that time. Denies any history of illicit drug use.  Review of Systems: A complete ROS was negative except as per HPI.   Physical Exam: Blood pressure 125/64, pulse 88, temperature 98.2 F (36.8 C),  temperature source Oral, resp. rate 12, SpO2 94 %.  Physical Exam Constitutional:      Appearance: She is obese.  HENT:     Head: Normocephalic and atraumatic.     Right Ear: External ear normal.     Left Ear: External ear normal.     Nose: Nose normal.     Mouth/Throat:     Mouth: Mucous membranes are moist.     Pharynx: Oropharynx is clear.  Eyes:     Extraocular Movements: Extraocular movements intact.     Pupils: Pupils are equal, round, and reactive to light.  Cardiovascular:     Rate and Rhythm: Normal rate and regular rhythm.     Pulses:          Dorsalis pedis pulses are 2+ on the right side and 2+ on the left side.       Posterior tibial pulses are 2+ on the right side and 2+ on the left side.     Heart sounds: Murmur (systolic) heard.    Pulmonary:     Effort: Pulmonary effort is normal.     Breath sounds: Normal breath sounds. No wheezing or rales.  Abdominal:     General: Abdomen is flat. Bowel sounds are normal.     Palpations: Abdomen is soft.  Musculoskeletal:     Right lower leg: Edema (trace) present.     Left lower leg: Edema (trace) present.     Comments: Large skin tear of the lateral left lower extremity with ecchymosis  held closed with large subcutaneous fluid collection, serous drainage, no bleeding, minimal erythema, no warmth or purulence   Skin:    General: Skin is warm and dry.     Capillary Refill: Capillary refill takes less than 2 seconds.  Neurological:     General: No focal deficit present.     Mental Status: She is alert and oriented to person, place, and time. Mental status is at baseline.     Sensory: No sensory deficit.  Psychiatric:        Mood and Affect: Mood normal.        Behavior: Behavior normal.     X-RAY LEFT TIBIA/FIBULA: FINDINGS: There is no evidence of fracture or other focal bone lesions. Soft tissues are unremarkable.  Assessment & Plan by Problem: Active Problems:   Wound of left lower extremity  [name] is a 70 year old female with PMHx of hypertension, CAD, CVA with residual left sided weakness, restless leg syndrome, and depression presenting for evaluation of left lower extremity wound after falling with feeling dizzy admitted for wound care.   Wound of left lower extremity Skin tear  after falling and tearing the skin of her left leg. Has a history of residual weakness of the left sided from stroke in 1991 making prone to injury on the left.. Initially evaluated at [facility] with 56 internal sutures and steri strips place. Started on Keflex. PCP sent her to ED cue to concern for infection. Evaluated in the ED and recived 1 dose of Vanc in the ED. - Continue vancomycin - Wound care RN counsulted - Tramadol to mg 4 time daily as needed, Oxycodone 5 mg every 4 hours as needed for pain - Follow up blood culture - Monitor CBCs  Dizziness Patient reports increased dizziness for the past couple of week with standing up. HD stable. Systolic murmur heard on exam. Denies prior imaging of the heart. History of MI in the 90s currently smokes. Concern underlying heart abnormality contributing to her dizziness and falls. -Echo ordered -orthostatic vitals - Monitor on tele  Hypertension Hyperlipidemia  -Normotensive, hold amlodipine - Continue rosuvastatin 20 mg daily  Intertrigo - nystatin powder twice daily  Depression -Continue fluoxetine and trazodone  Diet: Heart Healthy IVF: None VTE: Enoxaparin Code: DNR  Dispo: Admit patient to Observation with expected length of stay less than 2 midnights.  Signed: Quincy Simmonds, MD 11/24/2020, 11:00 AM  Pager: [phone] After 5pm on weekdays and 1pm on weekends: On Call pager: [phone]

## 2020-11-24 NOTE — ED Notes (Signed)
1st Lactic normal. 2nd to be discontinued.

## 2020-11-24 NOTE — ED Provider Notes (Signed)
[facility] EMERGENCY DEPARTMENT Provider Note   CSN: [id number] Arrival date & time: 11/23/20  1315     History Chief Complaint  Patient presents with  . Wound Check    Isel Truglio is a 70 y.o. female.  Madeeha Virrueta is a 70 y.o. female with history of hypertension, hyperlipidemia, diabetes, stroke, GERD, restless leg, depression, who presents to the emergency department for wound check. Patient reports that Wednesday night she had a mechanical fall causing a large skin tear to the lateral portion of the left lower leg. She was seen in the ED at [facility] had a 56 sutures placed, some of which were internal as well as Steri-Strips, they placed her on Keflex. She reports that yesterday she followed up with her primary care doctor for wound check because she was concerned the area was swollen and very discolored and may be getting infected. She has noted some surrounding redness and significantly increased pain in this area. She was not sent home with anything for pain, has been taking tramadol that she has at home without any relief. She has not had any fevers. She has noted some clear fluid drainage but no purulent drainage. Patient reports that she has been taking the prescribed antibiotics as directed. She saw her PCP yesterday who sent her to the emergency department for further evaluation with concern for wound infection. No other aggravating or alleviating factors.         Past Medical History:  Diagnosis Date  . CVA (cerebral vascular accident) (HCC)   . Depression   . Diabetes (HCC)   . GERD (gastroesophageal reflux disease)   . HTN (hypertension)   . Hyperlipidemia   . Left-sided weakness   . RLS (restless legs syndrome)     There are no problems to display for this patient.   Past Surgical History:  Procedure Laterality Date  . ABDOMINAL HYSTERECTOMY    . ACHILLES TENDON REPAIR     complication of postop wound requiring home health wound care   . CARPAL TUNNEL RELEASE Right   . CHOLECYSTECTOMY    . L4-L5 with Coflex device    . partial nail avulsion medial border hallux Left   . TONSILLECTOMY       OB History   No obstetric history on file.     No family history on file.  Social History   Tobacco Use  . Smoking status: Current Every Day Smoker  . Smokeless tobacco: Never Used  Substance Use Topics  . Alcohol use: Not Currently  . Drug use: Never    Home Medications Prior to Admission medications   Medication Sig Start Date End Date Taking? Authorizing Provider  amLODipine-benazepril (LOTREL) 5-20 MG capsule Take 1 capsule by mouth daily. 03/31/20   [provider]  cyclobenzaprine (FLEXERIL) 10 MG tablet Take 10 mg by mouth 2 (two) times daily as needed. 03/31/20   [provider]  FLUoxetine (PROZAC) 40 MG capsule Take 40 mg by mouth daily. 05/14/20   [provider]  gabapentin (NEURONTIN) 300 MG capsule Take 300 mg by mouth 3 (three) times daily. 05/15/20   [provider]  ofloxacin (OCUFLOX) 0.3 % ophthalmic solution  05/23/20   [provider]  omeprazole (PRILOSEC) 20 MG capsule Take 20 mg by mouth daily. 12/22/19   [provider]  omeprazole (PRILOSEC) 40 MG capsule Take 40 mg by mouth daily. 07/25/20   [provider]  prednisoLONE acetate (PRED FORTE) 1 % ophthalmic suspension  05/23/20   [provider]  rOPINIRole (REQUIP) 3 MG tablet Take 3 mg by mouth at bedtime. 05/12/20   [provider]  rosuvastatin (CRESTOR) 20 MG tablet Take 20 mg by mouth at bedtime. 03/29/20   [provider]  traMADol (ULTRAM) 50 MG tablet Take 50 mg by mouth 4 (four) times daily as needed. 05/08/20   [provider]  traZODone (DESYREL) 100 MG tablet  05/16/20   [provider]  trazodone (DESYREL) 300 MG tablet Take 300 mg by mouth at bedtime. 04/05/20   [provider]  valACYclovir (VALTREX) 1000 MG tablet Take 4,000 mg  by mouth once. 09/20/20   [provider]    Allergies    Patient has no known allergies.  Review of Systems   Review of Systems  Constitutional: Negative for chills and fever.  HENT: Negative.   Respiratory: Negative for cough and shortness of breath.   Cardiovascular: Positive for leg swelling. Negative for chest pain.  Gastrointestinal: Negative for abdominal pain, nausea and vomiting.  Musculoskeletal: Positive for myalgias. Negative for arthralgias.  Skin: Positive for color change and wound.  Neurological: Negative for dizziness, syncope, weakness, light-headedness and numbness.  All other systems reviewed and are negative.   Physical Exam Updated Vital Signs BP 105/66   Pulse 81   Temp 98.2 F (36.8 C) (Oral)   Resp 18   SpO2 100%   Physical Exam Vitals and nursing note reviewed.  Constitutional:      General: She is not in acute distress.    Appearance: Normal appearance. She is well-developed and well-nourished. She is obese. She is not ill-appearing or diaphoretic.  HENT:     Head: Normocephalic and atraumatic.     Mouth/Throat:     Mouth: Oropharynx is clear and moist.  Eyes:     General:        Right eye: No discharge.        Left eye: No discharge.     Extraocular Movements: EOM normal.     Pupils: Pupils are equal, round, and reactive to light.  Cardiovascular:     Rate and Rhythm: Normal rate and regular rhythm.     Pulses: Intact distal pulses.     Heart sounds: Normal heart sounds. No murmur heard. No friction rub. No gallop.   Pulmonary:     Effort: Pulmonary effort is normal. No respiratory distress.     Breath sounds: Normal breath sounds. No wheezing or rales.     Comments: Respirations equal and unlabored, patient able to speak in full sentences, lungs clear to auscultation bilaterally  Abdominal:     General: Bowel sounds are normal. There is no distension.     Palpations: Abdomen is soft. There is no mass.     Tenderness: There is  no abdominal tenderness. There is no guarding.  Musculoskeletal:        General: Swelling and tenderness present. No edema.     Cervical back: Neck supple.     Comments: Lateral portion of the left lower leg with large wound present with sutures and Steri-Strips in place There is a large area of dusky discolored tissue with edema and 2 large bullae, there is some serous fluid drainage, no obvious purulent drainage, some surrounding erythema.  The wound is exquisitely tender to touch. Distal pulses present and confirmed with doppler. (see photos below)   Skin:    General: Skin is warm and dry.     Capillary Refill:  Capillary refill takes less than 2 seconds.  Neurological:     Mental Status: She is alert.     Coordination: Coordination normal.     Comments: Speech is clear, able to follow commands Moves extremities without ataxia, coordination intact  Psychiatric:        Mood and Affect: Mood normal.        Behavior: Behavior normal.           ED Results / Procedures / Treatments   Labs (all labs ordered are listed, but only abnormal results are displayed) Labs Reviewed  CBC WITH DIFFERENTIAL/PLATELET - Abnormal; Notable for the following components:      Result Value   RDW 15.7 (*)    All other components within normal limits  BASIC METABOLIC PANEL - Abnormal; Notable for the following components:   Glucose, Bld 108 (*)    Creatinine, Ser 1.05 (*)    GFR, Estimated 56 (*)    All other components within normal limits  CULTURE, BLOOD (ROUTINE X 2)  CULTURE, BLOOD (ROUTINE X 2)  SARS CORONAVIRUS 2 (TAT 6-24 HRS)  LACTIC ACID, PLASMA  LACTIC ACID, PLASMA    EKG None  Radiology DG Tibia/Fibula Left  Result Date: 11/24/2020 CLINICAL DATA:  Wound infection. EXAM: LEFT TIBIA AND FIBULA - 2 VIEW COMPARISON:  None. FINDINGS: There is no evidence of fracture or other focal bone lesions. Soft tissues are unremarkable. IMPRESSION: Negative. Electronically Signed   By: Lupita Raider M.D.   On: 11/24/2020 08:28    Procedures Procedures   Medications Ordered in ED Medications  oxyCODONE-acetaminophen (PERCOCET/ROXICET) 5-325 MG per tablet 1 tablet (1 tablet Oral Given 11/23/20 1638)  vancomycin (VANCOREADY) IVPB 2000 mg/400 mL (2,000 mg Intravenous New Bag/Given 11/24/20 0849)  ondansetron (ZOFRAN-ODT) disintegrating tablet 4 mg (4 mg Oral Given 11/23/20 1638)  ondansetron (ZOFRAN) injection 4 mg (4 mg Intravenous Given 11/24/20 0846)  morphine 4 MG/ML injection 4 mg (4 mg Intravenous Given 11/24/20 0846)    ED Course  I have reviewed the triage vital signs and the nursing notes.  Pertinent labs & imaging results that were available during my care of the patient were reviewed by me and considered in my medical decision making (see chart for details).    MDM Rules/Calculators/A&P                          70 year old female presents with concern for wound infection.  She had sutures placed in a large wound after mechanical fall in the lateral portion of the left lower leg, she has been taking Keflex but reports despite this she has had increasing swelling, redness and discoloration to the wound.  She has not noted any purulent drainage but has had some clear fluid drainage.  No fevers or systemic symptoms.  On exam patient has very large wound with area of dusky discolored tissue with 2 large bullae containing serous fluid, I removed a few sutures from the area and did not see any areas of purulent drainage, but the area is exquisitely tender to palpation.  Certainly concern for wound infection as well as areas of devitalized tissue, if not improving may require debridement.  We will start patient on IV vancomycin, basic labs obtained from triage which are overall reassuring, no leukocytosis and normal hemoglobin.  Creatinine slightly increased from baseline at 1.05, but no other significant electrolyte derangements.  We will also check lactic acid, blood cultures and x-ray  of  the left tib-fib to assess for any potential bony involvement.  I have personally reviewed patient's x-ray imaging, fortunately there is no evidence of bone involvement or other acute abnormality.  Patient's lactic acid is not elevated.  Pain treated and improved here in the ED and patient started on IV vancomycin, will require hospital admission for close monitoring of wound, if tissue continues to look devitalized and patient is not improving with elevation and improvement in edema and IV antibiotics, may need debridement.  Case discussed with internal medicine teaching service who will see and admit the patient.   Final Clinical Impression(s) / ED Diagnoses Final diagnoses:  Wound infection    Rx / DC Orders ED Discharge Orders    None       Legrand Rams 11/24/20 1007    Rolan Bucco, MD 11/24/20 1209

## 2020-11-25 DIAGNOSIS — G2581 Restless legs syndrome: Secondary | ICD-10-CM | POA: Diagnosis present

## 2020-11-25 DIAGNOSIS — L089 Local infection of the skin and subcutaneous tissue, unspecified: Secondary | ICD-10-CM | POA: Diagnosis not present

## 2020-11-25 DIAGNOSIS — Z6841 Body Mass Index (BMI) 40.0 and over, adult: Secondary | ICD-10-CM | POA: Diagnosis not present

## 2020-11-25 DIAGNOSIS — E669 Obesity, unspecified: Secondary | ICD-10-CM | POA: Diagnosis present

## 2020-11-25 DIAGNOSIS — I252 Old myocardial infarction: Secondary | ICD-10-CM | POA: Diagnosis not present

## 2020-11-25 DIAGNOSIS — S81812A Laceration without foreign body, left lower leg, initial encounter: Secondary | ICD-10-CM | POA: Diagnosis not present

## 2020-11-25 DIAGNOSIS — E1151 Type 2 diabetes mellitus with diabetic peripheral angiopathy without gangrene: Secondary | ICD-10-CM | POA: Diagnosis present

## 2020-11-25 DIAGNOSIS — L03116 Cellulitis of left lower limb: Secondary | ICD-10-CM | POA: Diagnosis present

## 2020-11-25 DIAGNOSIS — Z20822 Contact with and (suspected) exposure to covid-19: Secondary | ICD-10-CM | POA: Diagnosis present

## 2020-11-25 DIAGNOSIS — E785 Hyperlipidemia, unspecified: Secondary | ICD-10-CM | POA: Diagnosis present

## 2020-11-25 DIAGNOSIS — L139 Bullous disorder, unspecified: Secondary | ICD-10-CM

## 2020-11-25 DIAGNOSIS — F32A Depression, unspecified: Secondary | ICD-10-CM | POA: Diagnosis present

## 2020-11-25 DIAGNOSIS — Z833 Family history of diabetes mellitus: Secondary | ICD-10-CM | POA: Diagnosis not present

## 2020-11-25 DIAGNOSIS — I951 Orthostatic hypotension: Secondary | ICD-10-CM | POA: Diagnosis present

## 2020-11-25 DIAGNOSIS — Z8249 Family history of ischemic heart disease and other diseases of the circulatory system: Secondary | ICD-10-CM | POA: Diagnosis not present

## 2020-11-25 DIAGNOSIS — I69354 Hemiplegia and hemiparesis following cerebral infarction affecting left non-dominant side: Secondary | ICD-10-CM | POA: Diagnosis not present

## 2020-11-25 DIAGNOSIS — Z79899 Other long term (current) drug therapy: Secondary | ICD-10-CM | POA: Diagnosis not present

## 2020-11-25 DIAGNOSIS — I10 Essential (primary) hypertension: Secondary | ICD-10-CM | POA: Diagnosis present

## 2020-11-25 DIAGNOSIS — Z803 Family history of malignant neoplasm of breast: Secondary | ICD-10-CM | POA: Diagnosis not present

## 2020-11-25 DIAGNOSIS — T148XXA Other injury of unspecified body region, initial encounter: Secondary | ICD-10-CM | POA: Diagnosis present

## 2020-11-25 DIAGNOSIS — L304 Erythema intertrigo: Secondary | ICD-10-CM | POA: Diagnosis present

## 2020-11-25 DIAGNOSIS — F17200 Nicotine dependence, unspecified, uncomplicated: Secondary | ICD-10-CM | POA: Diagnosis present

## 2020-11-25 DIAGNOSIS — R011 Cardiac murmur, unspecified: Secondary | ICD-10-CM | POA: Diagnosis present

## 2020-11-25 DIAGNOSIS — K219 Gastro-esophageal reflux disease without esophagitis: Secondary | ICD-10-CM | POA: Diagnosis present

## 2020-11-25 DIAGNOSIS — Z66 Do not resuscitate: Secondary | ICD-10-CM | POA: Diagnosis present

## 2020-11-25 LAB — BASIC METABOLIC PANEL
Anion gap: 8 (ref 5–15)
BUN: 11 mg/dL (ref 8–23)
CO2: 25 mmol/L (ref 22–32)
Calcium: 8.3 mg/dL — ABNORMAL LOW (ref 8.9–10.3)
Chloride: 102 mmol/L (ref 98–111)
Creatinine, Ser: 0.99 mg/dL (ref 0.44–1.00)
GFR, Estimated: >60 mL/min (ref >60)
Glucose, Bld: 125 mg/dL — ABNORMAL HIGH (ref 70–99)
Potassium: 4.3 mmol/L (ref 3.5–5.1)
Sodium: 135 mmol/L (ref 135–145)

## 2020-11-25 LAB — CBC
HCT: 34.4 % — ABNORMAL LOW (ref 36.0–46.0)
Hemoglobin: 10.7 g/dL — ABNORMAL LOW (ref 12.0–15.0)
MCH: 27 pg (ref 26.0–34.0)
MCHC: 31.1 g/dL (ref 30.0–36.0)
MCV: 86.9 fL (ref 80.0–100.0)
Platelets: 194 10*3/uL (ref 150–400)
RBC: 3.96 MIL/uL (ref 3.87–5.11)
RDW: 15.7 % — ABNORMAL HIGH (ref 11.5–15.5)
WBC: 5.8 10*3/uL (ref 4.0–10.5)
nRBC: 0 % (ref 0.0–0.2)

## 2020-11-25 MED ORDER — BENAZEPRIL HCL 20 MG PO TABS
20.0000 mg | ORAL_TABLET | Freq: Every day | ORAL | Status: DC
  Administered 2020-11-25 – 2020-11-28 (×4): 20 mg via ORAL
  Filled 2020-11-25 (×5): qty 1

## 2020-11-25 MED ORDER — ROPINIROLE HCL 1 MG PO TABS
3.0000 mg | ORAL_TABLET | Freq: Every day | ORAL | Status: DC
  Administered 2020-11-25 – 2020-11-27 (×3): 3 mg via ORAL
  Filled 2020-11-25 (×3): qty 3

## 2020-11-25 MED ORDER — VANCOMYCIN HCL 750 MG/150ML IV SOLN
750.0000 mg | INTRAVENOUS | Status: DC
  Administered 2020-11-25 – 2020-11-28 (×4): 750 mg via INTRAVENOUS
  Filled 2020-11-25 (×4): qty 150

## 2020-11-25 MED ORDER — AMLODIPINE BESYLATE 5 MG PO TABS
5.0000 mg | ORAL_TABLET | Freq: Every day | ORAL | Status: DC
  Administered 2020-11-25 – 2020-11-28 (×4): 5 mg via ORAL
  Filled 2020-11-25 (×4): qty 1

## 2020-11-25 MED ORDER — AMLODIPINE BESY-BENAZEPRIL HCL 5-20 MG PO CAPS: 1.0000 | ORAL_CAPSULE | Freq: Every day | ORAL | Status: DC

## 2020-11-25 MED ORDER — ONDANSETRON 4 MG PO TBDP
8.0000 mg | ORAL_TABLET | Freq: Three times a day (TID) | ORAL | Status: DC | PRN
  Administered 2020-11-25 – 2020-11-28 (×6): 8 mg via ORAL
  Filled 2020-11-25 (×6): qty 2

## 2020-11-25 MED ORDER — GABAPENTIN 300 MG PO CAPS: 300.0000 mg | ORAL_CAPSULE | Freq: Three times a day (TID) | ORAL | Status: DC

## 2020-11-25 NOTE — Consult Note (Signed)
Orthopaedic Trauma Service (OTS) Consult   Patient ID: Linda Mathis MRN: [id number] DOB/AGE: 1951-06-15 70 y.o.  Reason for Consult:Left leg wound Referring Physician: Dr. Erlinda Hong, MD Internal Medicine  HPI: Linda Mathis is an 70 y.o. female who is being seen in consultation at the request of Dr. Oswaldo Done for evaluation of left leg wound.  Patient states that she injured her leg on Wednesday had a skin tear over the anterior lateral aspect of her leg.  She presents emergency room where the skin flap was tacked down and repaired.  Since that time has gotten more discolored blister developed and also has gotten painful with some surrounding erythema.  She presents to the emergency room and was admitted and placed on antibiotics.  I was consulted for evaluation and treatment for possible debridement.  Patient states that the leg is quite tender.  She denies any injuries anywhere else.  Denies any numbness or tingling to the lower extremity.  She does have a history of diabetes as well as vascular disease she is ambulatory at baseline but with a recent pain is been hard to ambulate.  Past Medical History:  Diagnosis Date  . CVA (cerebral vascular accident) (HCC)   . Depression   . Diabetes (HCC)   . GERD (gastroesophageal reflux disease)   . HTN (hypertension)   . Hyperlipidemia   . Left-sided weakness   . RLS (restless legs syndrome)     Past Surgical History:  Procedure Laterality Date  . ABDOMINAL HYSTERECTOMY    . ACHILLES TENDON REPAIR     complication of postop wound requiring home health wound care  . CARPAL TUNNEL RELEASE Right   . CHOLECYSTECTOMY    . L4-L5 with Coflex device    . partial nail avulsion medial border hallux Left   . TONSILLECTOMY      No family history on file.  Social History:  reports that she has been smoking. She has never used smokeless tobacco. She reports previous alcohol use. She reports that she does not use drugs.  Allergies: No Known  Allergies  Medications:  No current facility-administered medications on file prior to encounter.   Current Outpatient Medications on File Prior to Encounter  Medication Sig Dispense Refill  . amLODipine-benazepril (LOTREL) 5-20 MG capsule Take 1 capsule by mouth daily.    . cephALEXin (KEFLEX) 500 MG capsule Take 500 mg by mouth 4 (four) times daily. Course started on 11/22/2020, 10 day course, end 12/02/2020    . FLUoxetine (PROZAC) 40 MG capsule Take 40 mg by mouth daily.    Marland Kitchen gabapentin (NEURONTIN) 300 MG capsule Take 300 mg by mouth 3 (three) times daily.    Marland Kitchen omeprazole (PRILOSEC) 40 MG capsule Take 40 mg by mouth daily.    Marland Kitchen rOPINIRole (REQUIP) 3 MG tablet Take 3 mg by mouth at bedtime.    . rosuvastatin (CRESTOR) 20 MG tablet Take 20 mg by mouth at bedtime.    . traMADol (ULTRAM) 50 MG tablet Take 50 mg by mouth 4 (four) times daily as needed.    . trazodone (DESYREL) 300 MG tablet Take 300 mg by mouth at bedtime.    . cyclobenzaprine (FLEXERIL) 10 MG tablet Take 10 mg by mouth 2 (two) times daily as needed. (Patient not taking: No sig reported)    . ofloxacin (OCUFLOX) 0.3 % ophthalmic solution  (Patient not taking: No sig reported)    . ondansetron (ZOFRAN-ODT) 4 MG disintegrating tablet Take 4 mg by mouth every 6 (six)  hours as needed.    . sulfamethoxazole-trimethoprim (BACTRIM DS) 800-160 MG tablet Take 1 tablet by mouth 2 (two) times daily. (Patient not taking: Reported on 11/24/2020)    . valACYclovir (VALTREX) 1000 MG tablet Take 4,000 mg by mouth once. (Patient not taking: No sig reported)      ROS: Constitutional: No fever or chills Vision: No changes in vision ENT: No difficulty swallowing CV: No chest pain Pulm: No SOB or wheezing GI: No nausea or vomiting GU: No urgency or inability to hold urine Skin: No poor wound healing Neurologic: No numbness or tingling Psychiatric: No depression or anxiety Heme: No bruising Allergic: No reaction to medications or  food   Exam: Blood pressure (!) 146/70, pulse 92, temperature 98.7 F (37.1 C), temperature source Oral, resp. rate 17, weight 105.5 kg, SpO2 94 %. General: No acute distress Orientation: Awake alert and oriented x3 Mood and Affect: Cooperative and pleasant Gait: Unable to assess due to her injury Coordination and balance: Within normal limits   Left lower extremity: Anterior lateral wound that has sutures that are in place.  There is discoloration of the skin flap.  There is no appreciable fluid collection underneath the skin flap however there is a large blister.  There is some surrounding erythema with significant tenderness to palpation around the laceration.  She is able to dorsiflex and plantarflex the ankle and foot.  She has sensation intact light touch.  Right lower extremity skin without lesions. No tenderness to palpation. Full painless ROM, full strength in each muscle groups without evidence of instability.   Medical Decision Making: Data: Imaging: X-rays were reviewed which shows no acute fracture  Labs:  Results for orders placed or performed during the hospital encounter of 11/23/20 (from the past 24 hour(s))  Culture, blood (routine x 2)     Status: None (Preliminary result)   Collection Time: 11/24/20  5:31 PM   Specimen: BLOOD  Result Value Ref Range   Specimen Description BLOOD RIGHT ANTECUBITAL    Special Requests      BOTTLES DRAWN AEROBIC ONLY Blood Culture results may not be optimal due to an inadequate volume of blood received in culture bottles   Culture      NO GROWTH < 24 HOURS Performed at [facility], [address], [place], [place] [address]    Report Status PENDING   CK     Status: None   Collection Time: 11/24/20  6:10 PM  Result Value Ref Range   Total CK 129 38 - 234 U/L  Basic metabolic panel     Status: Abnormal   Collection Time: 11/25/20 12:57 AM  Result Value Ref Range   Sodium 135 135 - 145 mmol/L   Potassium 4.3 3.5 - 5.1  mmol/L   Chloride 102 98 - 111 mmol/L   CO2 25 22 - 32 mmol/L   Glucose, Bld 125 (H) 70 - 99 mg/dL   BUN 11 8 - 23 mg/dL   Creatinine, Ser 1.11 0.44 - 1.00 mg/dL   Calcium 8.3 (L) 8.9 - 10.3 mg/dL   GFR, Estimated >55 >20 mL/min   Anion gap 8 5 - 15  CBC     Status: Abnormal   Collection Time: 11/25/20 12:57 AM  Result Value Ref Range   WBC 5.8 4.0 - 10.5 K/uL   RBC 3.96 3.87 - 5.11 MIL/uL   Hemoglobin 10.7 (L) 12.0 - 15.0 g/dL   HCT 80.2 (L) 23.3 - 61.2 %  MCV 86.9 80.0 - 100.0 fL   MCH 27.0 26.0 - 34.0 pg   MCHC 31.1 30.0 - 36.0 g/dL   RDW 56.8 (H) 12.7 - 51.7 %   Platelets 194 150 - 400 K/uL   nRBC 0.0 0.0 - 0.2 %    Imaging or Labs ordered: None  Medical history and chart was reviewed and case discussed with medical provider.  Assessment/Plan: 69 year old female with history diabetes and peripheral vascular disease with a left lower extremity skin tear with subsequent cellulitis and potential necrosis of the skin.  I released the the fluid and the blister at bedside.  There is no fluctuance underneath the skin flap however there is some surrounding erythema.  I would recommend continue with antibiotics.  The skin could be used as a potential biologic dressing however if it does not improve in terms of her pain or erythema she may need to undergo a debridement.  I will likely consult on one of my colleagues Dr.Duda tomorrow for evaluation.  She can have a diet today.  She does not need to be n.p.o. after midnight.  Roby Lofts, MD [facility] [phone] (office) [email or web address]

## 2020-11-25 NOTE — Progress Notes (Signed)
Dr Lavina Hamman made aware patient cannot tolerate sitting or standing for orthostatic vitals

## 2020-11-25 NOTE — Progress Notes (Signed)
HD#0 Subjective:  Overnight Events: None  Having more pain in the left leg today. Has not tried bear weight. Feels pain is well controlled on current medications.   Objective:  Vital signs in last 24 hours: Vitals:   11/24/20 1706 11/24/20 2027 11/25/20 0021 11/25/20 0519  BP: 133/63 129/62 (!) 152/74 (!) 145/58  Pulse: 84 100 93 92  Resp: 16 14 17 16   Temp: 98.3 F (36.8 C) 98.3 F (36.8 C) 98.7 F (37.1 C) 98.1 F (36.7 C)  TempSrc: Oral Oral Oral Oral  SpO2: 98% 96% 92% 92%  Weight:    105.5 kg   Supplemental O2: Room Air SpO2: 92 % O2 Flow Rate (L/min): 0 L/min FiO2 (%): 21 %   Physical Exam:  Constitutional: Laying in bed, no acute distress Eyes: PERRLA, EOMI Extremities: Wound of left leg with enlarging bulla with superior spread. Erythema of lower wound border, very tender to palpation, 2+ pulse distally, normal cap refill of left foot  Neurological: sensation and strength intact      Filed Weights   11/25/20 0519  Weight: 105.5 kg    Intake/Output Summary (Last 24 hours) at 11/25/2020 0647 Last data filed at 11/25/2020 0021 Gross per 24 hour  Intake --  Output 500 ml  Net -500 ml   Net IO Since Admission: -500 mL [11/25/20 0647]  Pertinent Labs: CBC Latest Ref Rng & Units 11/25/2020 11/23/2020  WBC 4.0 - 10.5 K/uL 5.8 7.5  Hemoglobin 12.0 - 15.0 g/dL 10.7(L) 12.3  Hematocrit 36.0 - 46.0 % 34.4(L) 39.2  Platelets 150 - 400 K/uL 194 222    CMP Latest Ref Rng & Units 11/25/2020 11/23/2020  Glucose 70 - 99 mg/dL 01/21/2021) 093(O)  BUN 8 - 23 mg/dL 11 10  Creatinine 671(I - 1.00 mg/dL 4.58 0.99)  Sodium 8.33(A - 145 mmol/L 135 136  Potassium 3.5 - 5.1 mmol/L 4.3 3.8  Chloride 98 - 111 mmol/L 102 101  CO2 22 - 32 mmol/L 25 26  Calcium 8.9 - 10.3 mg/dL 8.3(L) 8.9   Imaging: DG Tibia/Fibula Left  Result Date: 11/24/2020 CLINICAL DATA:  Wound infection. EXAM: LEFT TIBIA AND FIBULA - 2 VIEW COMPARISON:  None. FINDINGS: There is no evidence of fracture or  other focal bone lesions. Soft tissues are unremarkable. IMPRESSION: Negative. Electronically Signed   By: 01/22/2021 M.D.   On: 11/24/2020 08:28   ECHOCARDIOGRAM COMPLETE  PRESSIONS   1. Left ventricular ejection fraction, by estimation, is 55 to 60%. The left ventricle has normal function. The left ventricle has no regional wall motion abnormalities. There is mild left ventricular hypertrophy. Left ventricular diastolic parameters are consistent with Grade I diastolic dysfunction (impaired relaxation).   2. Right ventricular systolic function is normal. The right ventricular size is normal.  3. The mitral valve is grossly normal. Trivial mitral valve regurgitation.  4. The aortic valve is tricuspid. Aortic valve regurgitation is trivial. Mild aortic valve sclerosis is present, with no evidence of aortic valve stenosis.  5. The inferior vena cava is normal in size with greater than 50% respiratory variability, suggesting right atrial pressure of 3 mmHg. Comparison(s): No prior Echocardiogram.  Assessment/Plan:   Active Problems:   Wound of left lower extremity  Patient Summary:  Ms Ayonna Speranza is a 70 year old female with PMHx of hypertension, CAD, CVA with residual left sided weakness, restless leg syndrome, and depression presenting for evaluation of left lower extremity wound after falling with feeling dizzy admitted  for wound care.  Wound of left lower extremity Skin tear after falling and tearing the skin of her left leg. Today with expanding bulla. Overlying skin appears dark and necrotic appearing. Very painful ereythema of the lower wound border concerning for infection. No systemic symptoms. Will consult with orthopedics for need for debridement - Consult orthopedic surgery - Restart Vancomycin  - Wound care RN counsulted - Tramadol to mg 4 time daily as needed, Oxycodone 5 mg every 4 hours as needed for pain - Follow up blood culture, no growth to date - Monitor  CBCs  Dizziness Patient reports increased dizziness for the past couple of week with standing up. Echo normal. Likely her fall was due to orthostatic hypotension and decreased PO intake.  -orthostatic vitals  Hypertension Hyperlipidemia  -Now hypertensive, restart home amlodipine 5 mg daily - Continue rosuvastatin 20 mg daily  Intertrigo - nystatin powder twice daily  Depression -Continue fluoxetine and trazodone  Diet: NPO pending ortho evaluation IVF: None VTE: Enoxaparin Code: DNR  Dispo: Anticipated discharge to Home in 2 days pending further management.   Quincy Simmonds, MD 11/25/2020, 6:47 AM Pager: [phone]  Please contact the on call pager after 5 pm and on weekends at [phone].

## 2020-11-25 NOTE — Progress Notes (Signed)
Pharmacy Antibiotic Note  Linda Mathis is a 70 y.o. female admitted on 11/23/2020 with left lower extremity wound infection. Patient initially presented to ED on 2/4 after falling at home and sustaining a large skin tear on lower leg. At the time, she received 1 dose of vancomycin 2g in the ED on 2/5 @~0900. Pharmacy has been consulted for vancomycin dosing.  Plan: Start vancomycin 750 mg IV q24h (Goal AUC 400-550) F/u cultures, LOT Obtain vancomycin levels as clinically indicated  Weight: 105.5 kg (232 lb 9.4 oz)  Temp (24hrs), Avg:98.4 F (36.9 C), Min:98.1 F (36.7 C), Max:98.7 F (37.1 C)  Recent Labs  Lab 11/23/20 1400 11/24/20 0833 11/25/20 0057  WBC 7.5  --  5.8  CREATININE 1.05*  --  0.99  LATICACIDVEN  --  1.8  --     CrCl cannot be calculated (Unknown ideal weight.).    No Known Allergies  Antimicrobials this admission: Vancomycin 2g IV x1 on 2/5 @~0900  Microbiology results: 2/5 BCx: ngtd  Thank you for allowing pharmacy to be a part of this patient's care.  Trixie Rude, PharmD PGY1 Acute Care Pharmacy Resident 11/25/2020 9:01 AM  Please check [email or web address] for unit-specific pharmacy phone numbers.

## 2020-11-26 LAB — CBC
HCT: 32 % — ABNORMAL LOW (ref 36.0–46.0)
Hemoglobin: 10.5 g/dL — ABNORMAL LOW (ref 12.0–15.0)
MCH: 28.4 pg (ref 26.0–34.0)
MCHC: 32.8 g/dL (ref 30.0–36.0)
MCV: 86.5 fL (ref 80.0–100.0)
Platelets: 194 10*3/uL (ref 150–400)
RBC: 3.7 MIL/uL — ABNORMAL LOW (ref 3.87–5.11)
RDW: 15.9 % — ABNORMAL HIGH (ref 11.5–15.5)
WBC: 5 10*3/uL (ref 4.0–10.5)
nRBC: 0 % (ref 0.0–0.2)

## 2020-11-26 LAB — BASIC METABOLIC PANEL
Anion gap: 6 (ref 5–15)
BUN: 8 mg/dL (ref 8–23)
CO2: 29 mmol/L (ref 22–32)
Calcium: 8.4 mg/dL — ABNORMAL LOW (ref 8.9–10.3)
Chloride: 102 mmol/L (ref 98–111)
Creatinine, Ser: 1.01 mg/dL — ABNORMAL HIGH (ref 0.44–1.00)
GFR, Estimated: 59 mL/min — ABNORMAL LOW (ref >60)
Glucose, Bld: 148 mg/dL — ABNORMAL HIGH (ref 70–99)
Potassium: 4.1 mmol/L (ref 3.5–5.1)
Sodium: 137 mmol/L (ref 135–145)

## 2020-11-26 NOTE — Consult Note (Addendum)
WOC Nurse Consult Note: Consult completed remotely after review of chart and photos. Reason for Consult: Large skin tear Wound type: LLE large skin tear that was sutured at [facility]. PCP suspected infection and sent her to the ED. Wound has a large bulla along with sutures.  Pressure Injury POA: NA Measurement: Deferred Wound bed: Purple with a large amount of fluid.  Periwound: Erythmatous Dressing procedure/placement/frequency: Gently apply Mepitel silicone nonadherent [place] # [phone]) to the surface of the wound on the LLE, cover with several ABD pads and wrap with a kerlix. Mepitel may stay on up to 5 days. Change ABD pads and Kerlix daily or PRN soiling.   Monitor the wound area(s) for worsening of condition such as: Signs/symptoms of infection, increase in size, development of or worsening of odor, development of pain, or increased pain at the affected locations.   Notify the medical team if any of these develop.  Thank you for the consult. WOC nurse will not follow at this time.   Please re-consult the WOC team if needed.  Renaldo Reel Katrinka Blazing, MSN, RN, CMSRN, Angus Seller, [facility] Wound Treatment Associate Pager [phone]

## 2020-11-26 NOTE — Progress Notes (Signed)
HD#1 Subjective:  Overnight Events: None  Patient is resting comfortably in bed. She endorses that the left leg is more sensitive since the release of her bullae yesterday. She does note some improvement in her pain. Discussed continuing antibiotics at this time.  Patient would also like evaluation of her bilateral elbow wounds. She endorses that the right elbow wound hurts more and just wants to make sure it's not infected.   Objective:  Vital signs in last 24 hours: Vitals:   11/25/20 1147 11/25/20 1746 11/25/20 2316 11/26/20 0446  BP: (!) 146/70 109/63 (!) 128/54 (!) 122/51  Pulse: 92 85 87 83  Resp: 17 17 18 18   Temp: 98.7 F (37.1 C) 98.5 F (36.9 C) 98.5 F (36.9 C) 98.5 F (36.9 C)  TempSrc: Oral Oral Oral Oral  SpO2: 94% 96% 91% 94%  Weight:    105.9 kg  Height:   4' 11.5" (1.511 m)    Supplemental O2: Room Air SpO2: 94 % O2 Flow Rate (L/min): 0 L/min FiO2 (%): 21 %   Physical Exam:  Constitutional: Laying in bed,  comfortable, no acute distress Respiratory: CTA, no increased work of breathing Cardiac Extremities: Left leg wound: Fluid collection released yesterday, warm to touch, no drainage,  Improved tenderness to palpation, 2+ pulse distally, normal cap refill of left foot  Neurological: sensation and strength intact   Filed Weights   11/25/20 0519 11/26/20 0446  Weight: 105.5 kg 105.9 kg    Intake/Output Summary (Last 24 hours) at 11/26/2020 0625 Last data filed at 11/26/2020 01/24/2021 Gross per 24 hour  Intake 160.25 ml  Output 1750 ml  Net -1589.75 ml   Net IO Since Admission: -1,849.75 mL [11/26/20 0625]  Pertinent Labs: CBC Latest Ref Rng & Units 11/26/2020 11/25/2020 11/23/2020  WBC 4.0 - 10.5 K/uL 5.0 5.8 7.5  Hemoglobin 12.0 - 15.0 g/dL 10.5(L) 10.7(L) 12.3  Hematocrit 36.0 - 46.0 % 32.0(L) 34.4(L) 39.2  Platelets 150 - 400 K/uL 194 194 222    CMP Latest Ref Rng & Units 11/26/2020 11/25/2020 11/23/2020  Glucose 70 - 99 mg/dL 01/21/2021) 672(C) 947(S)  BUN  8 - 23 mg/dL 8 11 10   Creatinine 0.44 - 1.00 mg/dL 962(E) 3.66(Q)  Sodium 135 - 145 mmol/L 137 135 136  Potassium 3.5 - 5.1 mmol/L 4.1 4.3 3.8  Chloride 98 - 111 mmol/L 102 102 101  CO2 22 - 32 mmol/L 29 25 26   Calcium 8.9 - 10.3 mg/dL 9.47) 8.3(L) 8.9  blood culutres negative 1 day Imaging: DG Tibia/Fibula Left  Result Date: 11/24/2020 CLINICAL DATA:  Wound infection. EXAM: LEFT TIBIA AND FIBULA - 2 VIEW COMPARISON:  None. FINDINGS: There is no evidence of fracture or other focal bone lesions. Soft tissues are unremarkable. IMPRESSION: Negative. Electronically Signed   By: M.D.   On: 11/24/2020 08:28   ECHOCARDIOGRAM COMPLETE  PRESSIONS   1. Left ventricular ejection fraction, by estimation, is 55 to 60%. The left ventricle has normal function. The left ventricle has no regional wall motion abnormalities. There is mild left ventricular hypertrophy. Left ventricular diastolic parameters are consistent with Grade I diastolic dysfunction (impaired relaxation).   2. Right ventricular systolic function is normal. The right ventricular size is normal.  3. The mitral valve is grossly normal. Trivial mitral valve regurgitation.  4. The aortic valve is tricuspid. Aortic valve regurgitation is trivial. Mild aortic valve sclerosis is present, with no evidence of aortic valve stenosis.  5. The inferior  vena cava is normal in size with greater than 50% respiratory variability, suggesting right atrial pressure of 3 mmHg. Comparison(s): No prior Echocardiogram.  Assessment/Plan:   Active Problems:   Wound of left lower extremity   Laceration of left leg  Patient Summary:  Linda Mathis is a 70 year old female with PMHx of hypertension, CAD, CVA with residual left sided weakness, restless leg syndrome, and depression presenting for evaluation of left lower extremity wound after falling with feeling dizzy admitted for wound care.  Wound of left lower extremity Skin tear  after falling and tearing the skin of her left leg. Fall secondary to orthostatic hypotension. Today leg is less painful but warm to touch, continues to have erythema. Will continue with antibiotics. No current plan for debridement for the nest 24-28 hours per ortho. - Consult orthopedic surgery, appreciate recommendations - Vancomycin per pharmacy - Wound care per RN - Tramadol to mg 4 time daily as needed, Oxycodone 5 mg every 4 hours as needed for pain - Orthostatic vitals - Blood culture, no growth to date - Monitor CBCs - PT/OT  Hypertension Hyperlipidemia  - Continue amlodipine 5 mg daily - Continue rosuvastatin 20 mg daily  Intertrigo - nystatin powder twice daily  Depression -Continue fluoxetine and trazodone  Diet: Heart healthy IVF: None VTE: Enoxaparin Code: DNR  Dispo: Anticipated discharge to Home in 2 days pending further management.   Quincy Simmonds, MD 11/26/2020, 6:25 AM Pager: [phone]  Please contact the on call pager after 5 pm and on weekends at [phone].

## 2020-11-26 NOTE — Progress Notes (Signed)
Ortho Note  Patient does not feel any significant difference.  Wound and area appear similar to his it did yesterday.  There is still some surrounding erythema.  It is less tender today as it was yesterday.  I will plan to reach out to Dr. Lajoyce Corners for his expertise in wound care and the decision regarding debridement.  At this time continue wound care. No current plans for debridement in next 24-48 hrs  Roby Lofts, MD [facility] [phone] (office) [email or web address]

## 2020-11-26 NOTE — Evaluation (Signed)
Physical Therapy Evaluation Patient Details Name: Linda Mathis MRN: [id number] DOB: [birth year] Today's Date: 11/26/2020   History of Present Illness  70yo female who fell at home and sustained a large left LE wound for which she received 12 sutures at [facility]. She was sent home, later examined by PCP and referred back to ED due to concerns of wound infection. PMH CVA, DM, HTN, HLD, hx achilles tendon repair  Clinical Impression   Patient received in bed, pleasant and cooperative, daughter present during session and very supportive; they report they will be able to get a lift chair and a ramp built quickly if needed. Had quite a bit of difficulty getting to EOB, however once there able to mobilize on a Min guard-MinA basis with bari-RW. Did have some mild dizziness with latter part of gait however this resolved with seated rest- will check orthostatics next session. Left in bed in chair position with all needs met, bed alarm active and family present. Will benefit from HHPT f/u moving forward.     Follow Up Recommendations Home health PT    Equipment Recommendations  Rolling walker with 5" wheels;3in1 (PT);Other (comment) (shower bench; needs youth bariatric RW/bariatric equipment overall)    Recommendations for Other Services       Precautions / Restrictions Precautions Precautions: Fall;Other (comment) Precaution Comments: possible orthostatic hypotension, residual L sided weakness from past CVA. L LE wound Restrictions Weight Bearing Restrictions: No LLE Weight Bearing: Weight bearing as tolerated      Mobility  Bed Mobility Overal bed mobility: Needs Assistance Bed Mobility: Supine to Sit;Sit to Supine     Supine to sit: Mod assist;HOB elevated Sit to supine: Min guard;HOB elevated   General bed mobility comments: ModA for her to elevate trunk and scoot around to EOB, only needed min guard for safety to return to supine with HOB elevated    Transfers Overall  transfer level: Needs assistance Equipment used: Rolling walker (2 wheeled) Transfers: Sit to/from Stand Sit to Stand: Min assist         General transfer comment: MinA to boost up to standing position with RW, cues for hand placement and increased time to gain balance  Ambulation/Gait Ambulation/Gait assistance: Min guard Gait Distance (Feet): 60 Feet Assistive device: Rolling walker (2 wheeled) Gait Pattern/deviations: Step-through pattern;Decreased step length - right;Decreased stride length;Decreased step length - left;Trunk flexed;Wide base of support Gait velocity: decreased   General Gait Details: slow but steady with RW, easily fatigued. Became mildly dizzy with extended time in standing position but quickly recovered with seated rest- will plan to check orthostatics next session  Stairs            Wheelchair Mobility    Modified Rankin (Stroke Patients Only)       Balance Overall balance assessment: Needs assistance;History of Falls Sitting-balance support: Bilateral upper extremity supported;Feet supported Sitting balance-Leahy Scale: Good     Standing balance support: Bilateral upper extremity supported;During functional activity Standing balance-Leahy Scale: Fair Standing balance comment: reliant on BUE support                             Pertinent Vitals/Pain Pain Assessment: 0-10 Pain Score: 4  Pain Location: L LE wound Pain Descriptors / Indicators: Aching;Discomfort;Sore Pain Intervention(s): Limited activity within patient's tolerance;Monitored during session;Premedicated before session    Home Living Family/patient expects to be discharged to:: Private residence Living Arrangements: Children Available Help at Discharge: Family;Available PRN/intermittently Type  of Home: House Home Access: Stairs to enter Entrance Stairs-Rails: None Entrance Stairs-Number of Steps: 3 Home Layout: One level Home Equipment: Hurley - 4 wheels;Shower  seat Additional Comments: currently planning to go home with daughter; daughter states they are already working on lift chair and ramp    Prior Function Level of Independence: Independent with assistive device(s)               Hand Dominance        Extremity/Trunk Assessment   Upper Extremity Assessment Upper Extremity Assessment: Generalized weakness    Lower Extremity Assessment Lower Extremity Assessment: Generalized weakness    Cervical / Trunk Assessment Cervical / Trunk Assessment: Kyphotic  Communication   Communication: No difficulties  Cognition Arousal/Alertness: Awake/alert Behavior During Therapy: WFL for tasks assessed/performed Overall Cognitive Status: Within Functional Limits for tasks assessed                                        General Comments      Exercises     Assessment/Plan    PT Assessment Patient needs continued PT services  PT Problem List Decreased strength;Obesity;Decreased knowledge of use of DME;Decreased activity tolerance;Decreased safety awareness;Decreased balance;Decreased mobility;Decreased coordination       PT Treatment Interventions DME instruction;Balance training;Gait training;Stair training;Functional mobility training;Patient/family education;Therapeutic activities;Therapeutic exercise    PT Goals (Current goals can be found in the Care Plan section)  Acute Rehab PT Goals Patient Stated Goal: go home with daughter, heal wound PT Goal Formulation: With patient/family Time For Goal Achievement: 12/10/20 Potential to Achieve Goals: Good    Frequency Min 3X/week   Barriers to discharge        Co-evaluation               AM-PAC PT "6 Clicks" Mobility  Outcome Measure Help needed turning from your back to your side while in a flat bed without using bedrails?: A Little Help needed moving from lying on your back to sitting on the side of a flat bed without using bedrails?: A Lot Help  needed moving to and from a bed to a chair (including a wheelchair)?: A Little Help needed standing up from a chair using your arms (e.g., wheelchair or bedside chair)?: A Lot Help needed to walk in hospital room?: A Little Help needed climbing 3-5 steps with a railing? : A Lot 6 Click Score: 15    End of Session Equipment Utilized During Treatment: Gait belt Activity Tolerance: Patient tolerated treatment well Patient left: in bed;with call bell/phone within reach;with bed alarm set (bed in chair position) Nurse Communication: Mobility status PT Visit Diagnosis: Muscle weakness (generalized) (M62.81);Unsteadiness on feet (R26.81);History of falling (Z91.81);Difficulty in walking, not elsewhere classified (R26.2)    Time: 5400-8676 PT Time Calculation (min) (ACUTE ONLY): 28 min   Charges:   PT Evaluation $PT Eval Moderate Complexity: 1 Mod PT Treatments $Gait Training: 8-22 mins        Windell Norfolk, DPT, PN1   Supplemental Physical Therapist [place]    Pager [phone] Acute Rehab Office [phone]

## 2020-11-27 ENCOUNTER — Encounter (HOSPITAL_COMMUNITY): Payer: Self-pay | Admitting: Student in an Organized Health Care Education/Training Program

## 2020-11-27 DIAGNOSIS — S81812A Laceration without foreign body, left lower leg, initial encounter: Secondary | ICD-10-CM

## 2020-11-27 LAB — CBC
HCT: 34.4 % — ABNORMAL LOW (ref 36.0–46.0)
Hemoglobin: 10.7 g/dL — ABNORMAL LOW (ref 12.0–15.0)
MCH: 26.9 pg (ref 26.0–34.0)
MCHC: 31.1 g/dL (ref 30.0–36.0)
MCV: 86.4 fL (ref 80.0–100.0)
Platelets: 205 10*3/uL (ref 150–400)
RBC: 3.98 MIL/uL (ref 3.87–5.11)
RDW: 15.5 % (ref 11.5–15.5)
WBC: 5.1 10*3/uL (ref 4.0–10.5)
nRBC: 0 % (ref 0.0–0.2)

## 2020-11-27 LAB — BASIC METABOLIC PANEL
Anion gap: 11 (ref 5–15)
BUN: 10 mg/dL (ref 8–23)
CO2: 27 mmol/L (ref 22–32)
Calcium: 8.5 mg/dL — ABNORMAL LOW (ref 8.9–10.3)
Chloride: 99 mmol/L (ref 98–111)
Creatinine, Ser: 1.07 mg/dL — ABNORMAL HIGH (ref 0.44–1.00)
GFR, Estimated: 55 mL/min — ABNORMAL LOW (ref >60)
Glucose, Bld: 149 mg/dL — ABNORMAL HIGH (ref 70–99)
Potassium: 4 mmol/L (ref 3.5–5.1)
Sodium: 137 mmol/L (ref 135–145)

## 2020-11-27 NOTE — TOC Initial Note (Addendum)
Transition of Care [facility]) - Initial/Assessment Note    Patient Details  Name: Linda Mathis MRN: [id number] Date of Birth: 03/28/1951  [facility]) CM/SW Contact:    Kingsley Plan, RN Phone Number: 11/27/2020, 3:25 PM  Clinical Narrative:                 Patient from home alone, but plans to stay with her daughter at discharge. Daughter's address [address] [place], [place] , phone number correct on face sheet.   Discussed home health PT/OT patient in agreement and would like [facility] . Patient states she will go to Dr Audrie Lia office for weekly dressing changes.   Patient has a walker and shower chair at home already and does not want a 3 in1 or bedside commode.   Kandee Keen with [facility] accepted referral for HHPT and OT   Expected Discharge Plan: Home w Home Health Services Barriers to Discharge: Continued Medical Work up   Patient Goals and CMS Choice Patient states their goals for this hospitalization and ongoing recovery are:: to return home CMS Medicare.gov Compare Post Acute Care list provided to:: Patient Choice offered to / list presented to : Patient  Expected Discharge Plan and Services Expected Discharge Plan: Home w Home Health Services   Discharge Planning Services: CM Consult Post Acute Care Choice: Home Health                   DME Arranged: N/A DME Agency: NA       HH Arranged: PT,OT HH Agency: [facility] Date [facility] Agency Contacted: 11/27/20 Time HH Agency Contacted: 1524 Representative spoke with at [facility] Agency: Kandee Keen  Prior Living Arrangements/Services   Lives with:: Self Patient language and need for interpreter reviewed:: Yes Do you feel safe going back to the place where you live?: Yes      Need for Family Participation in Patient Care: Yes (Comment) Care giver support system in place?: Yes (comment) Current home services: DME Criminal Activity/Legal Involvement Pertinent to Current Situation/Hospitalization: No - Comment  as needed  Activities of Daily Living Home Assistive Devices/Equipment: Dentures (specify type) ADL Screening (condition at time of admission) Patient's cognitive ability adequate to safely complete daily activities?: Yes Is the patient deaf or have difficulty hearing?: No Does the patient have difficulty seeing, even when wearing glasses/contacts?: No Does the patient have difficulty concentrating, remembering, or making decisions?: No Patient able to express need for assistance with ADLs?: Yes Does the patient have difficulty dressing or bathing?: No Independently performs ADLs?: Yes (appropriate for developmental age) Does the patient have difficulty walking or climbing stairs?: Yes Weakness of Legs: Left Weakness of Arms/Hands: None  Permission Sought/Granted   Permission granted to share information with : No              Emotional Assessment Appearance:: Appears stated age Attitude/Demeanor/Rapport: Engaged Affect (typically observed): Accepting Orientation: : Oriented to Situation,Oriented to  Time,Oriented to Place,Oriented to Self Alcohol / Substance Use: Not Applicable Psych Involvement: No (comment)  Admission diagnosis:  Wound infection [T14.8XXA, L08.9] Wound of left lower extremity [S81.802A] Laceration of left leg [S81.812A] Patient Active Problem List   Diagnosis Date Noted  . Laceration of left leg 11/25/2020  . Wound of left lower extremity 11/24/2020   PCP:  [name], MD Pharmacy:   [facility] [address] [place] - [address] [place] [address] Phone: [phone] Fax: [phone]     Social  Determinants of Health (SDOH) Interventions    Readmission Risk Interventions No flowsheet data found.

## 2020-11-27 NOTE — Consult Note (Signed)
WOC Nurse Consult Note: Patient receiving care in [facility] 5C12.  Assisted with profore application by primary RN Windell Moulding. Reason for Consult: profore application to LLE Wound type: large skin tear, sutured in place Pressure Injury POA: Yes/No/NA Measurement: na Wound bed: protected by 2 Mepitel and a sacral foam dressing for added absorption Drainage (amount, consistency, odor) serosanginous on pillow under leg Periwound: bruised Dressing procedure/placement/frequency: 4 ayer Profore wrap applied. Patient tolerated well. ase re-consult the WOC team if needed. Patient states she is being discharged tomorrow and will be followed up weekly at Dr. Audrie Lia office.  Helmut Muster, RN, MSN, CWOCN, CNS-BC, pager [phone]

## 2020-11-27 NOTE — Consult Note (Addendum)
ORTHOPAEDIC CONSULTATION  REQUESTING PHYSICIAN: Linda Lagos, MD  Chief Complaint: Skin tear left leg.  HPI: Linda Mathis is a 70 y.o. female who presents with a large skin tear flap involving the entire left leg from the knee to the ankle.  Patient states she was at home bending over in a dresser drawer in her closet when she stood up she felt lightheaded thought she could make it to the bed and had a syncopal episode with her legs striking the edge of the bed.  She initially had traumatic wound closure and presents at this time for evaluation and treatment of the large soft tissue flap.  Past Medical History:  Diagnosis Date  . CVA (cerebral vascular accident) (HCC)   . Depression   . Diabetes (HCC)   . Family history of adverse reaction to anesthesia   . GERD (gastroesophageal reflux disease)   . HTN (hypertension)   . Hyperlipidemia   . Left-sided weakness   . RLS (restless legs syndrome)   . Wound of left leg 11/21/2020   MULTIPLE STICHES   Past Surgical History:  Procedure Laterality Date  . ABDOMINAL HYSTERECTOMY    . ACHILLES TENDON REPAIR     complication of postop wound requiring home health wound care  . CARPAL TUNNEL RELEASE Right   . CHOLECYSTECTOMY    . L4-L5 with Coflex device    . partial nail avulsion medial border hallux Left   . TONSILLECTOMY     Social History   Socioeconomic History  . Marital status: Widowed    Spouse name: Not on file  . Number of children: Not on file  . Years of education: Not on file  . Highest education level: Not on file  Occupational History  . Not on file  Tobacco Use  . Smoking status: Current Every Day Smoker    Types: Cigarettes  . Smokeless tobacco: Never Used  Vaping Use  . Vaping Use: Never used  Substance and Sexual Activity  . Alcohol use: Not Currently  . Drug use: Never  . Sexual activity: Not on file  Other Topics Concern  . Not on file  Social History Narrative  . Not on file   Social  Determinants of Health   Financial Resource Strain: Not on file  Food Insecurity: Not on file  Transportation Needs: Not on file  Physical Activity: Not on file  Stress: Not on file  Social Connections: Not on file   History reviewed. No pertinent family history. - negative except otherwise stated in the family history section No Known Allergies Prior to Admission medications   Medication Sig Start Date End Date Taking? Authorizing Provider  amLODipine-benazepril (LOTREL) 5-20 MG capsule Take 1 capsule by mouth daily. 03/31/20  Yes [provider]  cephALEXin (KEFLEX) 500 MG capsule Take 500 mg by mouth 4 (four) times daily. Course started on 11/22/2020, 10 day course, end 12/02/2020 11/22/20 12/02/20 Yes [provider]  FLUoxetine (PROZAC) 40 MG capsule Take 40 mg by mouth daily. 05/14/20  Yes [provider]  gabapentin (NEURONTIN) 300 MG capsule Take 300 mg by mouth 3 (three) times daily. 05/15/20  Yes [provider]  omeprazole (PRILOSEC) 40 MG capsule Take 40 mg by mouth daily. 07/25/20  Yes [provider]  rOPINIRole (REQUIP) 3 MG tablet Take 3 mg by mouth at bedtime. 05/12/20  Yes [provider]  rosuvastatin (CRESTOR) 20 MG tablet Take 20 mg by mouth at bedtime. 03/29/20  Yes [provider]  traMADol (ULTRAM) 50 MG tablet Take 50 mg by mouth 4 (four) times daily as needed. 05/08/20  Yes [provider]  trazodone (DESYREL) 300 MG tablet Take 300 mg by mouth at bedtime. 04/05/20  Yes [provider]  cyclobenzaprine (FLEXERIL) 10 MG tablet Take 10 mg by mouth 2 (two) times daily as needed. Patient not taking: No sig reported 03/31/20   [provider]  ofloxacin (OCUFLOX) 0.3 % ophthalmic solution  05/23/20   [provider]  ondansetron (ZOFRAN-ODT) 4 MG disintegrating tablet Take 4 mg by mouth every 6 (six) hours as needed. 09/29/20   [provider]  sulfamethoxazole-trimethoprim  (BACTRIM DS) 800-160 MG tablet Take 1 tablet by mouth 2 (two) times daily. Patient not taking: Reported on 11/24/2020 10/23/20   [provider]  valACYclovir (VALTREX) 1000 MG tablet Take 4,000 mg by mouth once. Patient not taking: No sig reported 09/20/20   [provider]   No results found. - pertinent xrays, CT, MRI studies were reviewed and independently interpreted  Positive ROS: All other systems have been reviewed and were otherwise negative with the exception of those mentioned in the HPI and as above.  Physical Exam: General: Alert, no acute distress Psychiatric: Patient is competent for consent with normal mood and affect Lymphatic: No axillary or cervical lymphadenopathy Cardiovascular: No pedal edema Respiratory: No cyanosis, no use of accessory musculature GI: No organomegaly, abdomen is soft and non-tender    Images:  @ENCIMAGES @  Labs:  Lab Results  Component Value Date   REPTSTATUS PENDING 11/24/2020   CULT  11/24/2020    NO GROWTH 3 DAYS Performed at [facility], [address], [place], [place]     No results found for: ALBUMIN, PREALBUMIN, LABURIC  Neurologic: Patient does not have protective sensation bilateral lower extremities.   MUSCULOSKELETAL:   Skin: Examination there is ischemic changes over the entire flap.  The wound edges are well approximated without dehiscence however there is some thin black eschar around the edges.  Patient has a strong and palpable dorsalis pedis pulse.  There is no ascending cellulitis no signs of infection.  There is serous blistering over the skin flap.  Assessment: Assessment: Large soft tissue skin flap with ischemic changes without infection.  Plan: I will consult wound ostomy continence nursing to apply a Profore wrap.  I will follow-up in the office in 1 week to begin wound care.  Discussed the risk of further skin ischemic changes and potential for surgical debridement  and skin grafting.  Patient is safe for discharge to home tomorrow from an orthopedic standpoint.  Thank you for the consult and the opportunity to see [name]  [id number], MD [facility] [phone] 10:20 AM

## 2020-11-27 NOTE — Progress Notes (Signed)
HD#2 Subjective:  Overnight Events: None  Her wound is not hurting as bad today but is itching.She was hurting a lot yesterday. Walked a little with PT yesterday.   Objective:  Vital signs in last 24 hours: Vitals:   11/26/20 1216 11/26/20 1828 11/26/20 2313 11/27/20 0539  BP: 109/70 (!) 109/51 (!) 124/50 (!) 143/52  Pulse: 68 75 86 82  Resp: 16 16 19 16   Temp: 98.3 F (36.8 C) 98.5 F (36.9 C) 98.6 F (37 C) 98.2 F (36.8 C)  TempSrc: Oral Oral Oral Oral  SpO2: 92% 93% 92% 94%  Weight:    103.6 kg  Height:       Supplemental O2: Room Air SpO2: 94 % O2 Flow Rate (L/min): 0 L/min FiO2 (%): 21 %   Physical Exam:  Constitutional: Laying in bed, comfortable, no acute distress Extremities: Left leg wound: erythema and warmth similar to yesterday. Serosanguinous drainage on dressing. No bleeding. No crepitus. Please refer to photos below Neurological: alert and oriented x4. sensation and strength intact      Filed Weights   11/25/20 0519 11/26/20 0446 11/27/20 0539  Weight: 105.5 kg 105.9 kg 103.6 kg    Intake/Output Summary (Last 24 hours) at 11/27/2020 0719 Last data filed at 11/27/2020 0551 Gross per 24 hour  Intake 390 ml  Output 800 ml  Net -410 ml   Net IO Since Admission: -2,259.75 mL [11/27/20 0719]  Pertinent Labs: CBC Latest Ref Rng & Units 11/26/2020 11/25/2020 11/23/2020  WBC 4.0 - 10.5 K/uL 5.0 5.8 7.5  Hemoglobin 12.0 - 15.0 g/dL 10.5(L) 10.7(L) 12.3  Hematocrit 36.0 - 46.0 % 32.0(L) 34.4(L) 39.2  Platelets 150 - 400 K/uL 194 194 222    CMP Latest Ref Rng & Units 11/26/2020 11/25/2020 11/23/2020  Glucose 70 - 99 mg/dL 01/21/2021) 818(E) 993(Z)  BUN 8 - 23 mg/dL 8 11 10   Creatinine 0.44 - 1.00 mg/dL 169(C) 7.89(F)  Sodium 135 - 145 mmol/L 137 135 136  Potassium 3.5 - 5.1 mmol/L 4.1 4.3 3.8  Chloride 98 - 111 mmol/L 102 102 101  CO2 22 - 32 mmol/L 29 25 26   Calcium 8.9 - 10.3 mg/dL 8.10) 8.3(L) 8.9    Imaging: DG Tibia/Fibula Left  Result Date:  11/24/2020 CLINICAL DATA:  Wound infection. EXAM: LEFT TIBIA AND FIBULA - 2 VIEW COMPARISON:  None. FINDINGS: There is no evidence of fracture or other focal bone lesions. Soft tissues are unremarkable. IMPRESSION: Negative. Electronically Signed   By: M.D.   On: 11/24/2020 08:28   ECHOCARDIOGRAM COMPLETE  PRESSIONS   1. Left ventricular ejection fraction, by estimation, is 55 to 60%. The left ventricle has normal function. The left ventricle has no regional wall motion abnormalities. There is mild left ventricular hypertrophy. Left ventricular diastolic parameters are consistent with Grade I diastolic dysfunction (impaired relaxation).   2. Right ventricular systolic function is normal. The right ventricular size is normal.  3. The mitral valve is grossly normal. Trivial mitral valve regurgitation.  4. The aortic valve is tricuspid. Aortic valve regurgitation is trivial. Mild aortic valve sclerosis is present, with no evidence of aortic valve stenosis.  5. The inferior vena cava is normal in size with greater than 50% respiratory variability, suggesting right atrial pressure of 3 mmHg. Comparison(s): No prior Echocardiogram.  Assessment/Plan:   Active Problems:   Wound of left lower extremity   Laceration of left leg  Patient Summary:  [name] is a 70  year old female with PMHx of hypertension, CAD, CVA with residual left sided weakness, restless leg syndrome, and depression presenting for evaluation of left lower extremity wound after falling with feeling dizzy admitted for wound care.  Wound of left lower extremity Skin tear after falling and tearing the skin of her left leg. Fall mostly likely due to orthostatic hypotension. Ereythema and warmth similar to yesterday. No current plan for debridement during admission.  - Consult orthopedic surgery, appreciate recommendations - Continue Vancomycin, plan to transition to oral tomorrow - Wound care per RN - Tramadol  50 mg every 6 hour PRN, Oxycodone 5 mg every 4 hours PRN - Orthostatic vitals - PT/OT: home health PT, rolling walker, 3 in 1  Hypertension Hyperlipidemia  - Continue amlodipine 5 mg daily, continue benazepril 20 mg daily - Continue rosuvastatin 20 mg daily  Intertrigo - nystatin powder twice daily  Depression -Continue fluoxetine and trazodone  Diet: Heart healthy IVF: None VTE: Enoxaparin Code: DNR  Dispo: Anticipated discharge to Home in 2 days pending further management.   Quincy Simmonds, MD 11/27/2020, 7:19 AM Pager: [phone]  Please contact the on call pager after 5 pm and on weekends at [phone].

## 2020-11-27 NOTE — Evaluation (Signed)
Occupational Therapy Evaluation Patient Details Name: Linda Mathis MRN: [id number] DOB: [birth year] Today's Date: 11/27/2020    History of Present Illness 70yo female who fell at home and sustained a large left LE wound for which she received 56 sutures at [facility]. She was sent home, later examined by PCP and referred back to ED due to concerns of wound infection. PMH CVA, DM, HTN, HLD, hx achilles tendon repair   Clinical Impression   Pt PTA: pt living alone and independent. Pt was recently, in [facility] for fun. Pt currently limited by pain in LLE when standing >3 mins and decreased ability to care for self. Pt standing for toilet hygiene and standing for washing hands. Pt's BP increased in standing see BP taken on RLE sitting: 155/86; standing 155/121; standing after 3 mins 158/119. RN aware. Slight dizziness reported x1 time when ambulating to bathroom with RW, but subsided quickly. Pt would benefit from continued OT skilled services for ADL, mobility and safety in HHOT setting. OT following acutely.    Follow Up Recommendations  Home health OT;Supervision - Intermittent    Equipment Recommendations  3 in 1 bedside commode    Recommendations for Other Services       Precautions / Restrictions Precautions Precautions: Fall;Other (comment) Precaution Comments: possible orthostatic hypotension, residual L sided weakness from past CVA. L LE wound Restrictions Weight Bearing Restrictions: Yes LLE Weight Bearing: Weight bearing as tolerated      Mobility Bed Mobility Overal bed mobility: Needs Assistance Bed Mobility: Rolling;Sidelying to Sit Rolling: Supervision Sidelying to sit: Min assist       General bed mobility comments: Min A for reaching for rail/OTR's hand to elevate trunk    Transfers Overall transfer level: Needs assistance Equipment used: Rolling walker (2 wheeled) Transfers: Sit to/from [employer] Sit to Stand: Supervision Stand  pivot transfers: Supervision       General transfer comment: no physical assist for power up    Balance Overall balance assessment: Needs assistance;History of Falls Sitting-balance support: Bilateral upper extremity supported;Feet supported Sitting balance-Leahy Scale: Good     Standing balance support: Bilateral upper extremity supported;Single extremity supported;During functional activity Standing balance-Leahy Scale: Fair Standing balance comment: using BUE support and single UE support for washing hands                           ADL either performed or assessed with clinical judgement   ADL Overall ADL's : Needs assistance/impaired Eating/Feeding: Modified independent;Sitting   Grooming: Supervision/safety;Standing   Upper Body Bathing: Set up;Supervision/ safety;Standing   Lower Body Bathing: Moderate assistance   Upper Body Dressing : Set up;Sitting;Standing   Lower Body Dressing: Moderate assistance;Sitting/lateral leans;Sit to/from stand   Toilet Transfer: Min guard;RW   Toileting- Architect and Hygiene: Supervision/safety;Sit to/from stand Toileting - Clothing Manipulation Details (indicate cue type and reason): pt performing own toilet hygiene     Functional mobility during ADLs: Min guard;Rolling walker;Cueing for safety;Supervision/safety General ADL Comments: Pt limited by pain in LLE when standing >3 mins and decreased ability to care for self. Pt standing for toilet hygiene and standing for washing hands.a     Vision Baseline Vision/History: No visual deficits Patient Visual Report: No change from baseline Vision Assessment?: No apparent visual deficits     Perception     Praxis      Pertinent Vitals/Pain Pain Assessment: 0-10 Pain Score: 4  Pain Location: L LE wound Pain Descriptors /  Indicators: Aching;Discomfort;Sore Pain Intervention(s): Monitored during session     Hand Dominance Right   Extremity/Trunk  Assessment Upper Extremity Assessment Upper Extremity Assessment: Overall WFL for tasks assessed   Lower Extremity Assessment Lower Extremity Assessment: Generalized weakness;LLE deficits/detail LLE Deficits / Details: LLE wound   Cervical / Trunk Assessment Cervical / Trunk Assessment: Kyphotic   Communication Communication Communication: No difficulties   Cognition Arousal/Alertness: Awake/alert Behavior During Therapy: WFL for tasks assessed/performed Overall Cognitive Status: Within Functional Limits for tasks assessed                                     General Comments  BP taken on RLE sitting: 155/86; standing 155/121; standing after 3 mins 158/119.    Exercises     Shoulder Instructions      Home Living Family/patient expects to be discharged to:: Private residence Living Arrangements: Alone Available Help at Discharge: Family;Available PRN/intermittently Type of Home: House Home Access: Stairs to enter [employer] of Steps: 3 Entrance Stairs-Rails: None Home Layout: One level     Bathroom Shower/Tub: Producer, television/film/video: Standard     Home Equipment: Environmental consultant - 4 wheels;Shower seat   Additional Comments: currently planning to go home with daughter; daughter states they are already working on lift chair and ramp      Prior Functioning/Environment Level of Independence: Independent with assistive device(s)        Comments: rollator for mobility        OT Problem List: Decreased activity tolerance;Increased edema;Decreased knowledge of use of DME or AE      OT Treatment/Interventions: Self-care/ADL training;Therapeutic exercise;Energy conservation;DME and/or AE instruction;Therapeutic activities;Patient/family education;Balance training    OT Goals(Current goals can be found in the care plan section) Acute Rehab OT Goals Patient Stated Goal: go home with daughter, heal wound OT Goal Formulation: With  patient Time For Goal Achievement: 12/11/20 Potential to Achieve Goals: Good ADL Goals Pt Will Perform Lower Body Dressing: with supervision;sitting/lateral leans;sit to/from stand;with adaptive equipment  OT Frequency: Min 2X/week   Barriers to D/C:            Co-evaluation              AM-PAC OT "6 Clicks" Daily Activity     Outcome Measure Help from another person eating meals?: None Help from another person taking care of personal grooming?: A Little Help from another person toileting, which includes using toliet, bedpan, or urinal?: A Little Help from another person bathing (including washing, rinsing, drying)?: A Little Help from another person to put on and taking off regular upper body clothing?: None Help from another person to put on and taking off regular lower body clothing?: A Little 6 Click Score: 20   End of Session Equipment Utilized During Treatment: Rolling walker Nurse Communication: Mobility status  Activity Tolerance: Patient tolerated treatment well Patient left: in chair;with call bell/phone within reach;with family/visitor present  OT Visit Diagnosis: Unsteadiness on feet (R26.81);Muscle weakness (generalized) (M62.81);Pain Pain - Right/Left: Left Pain - part of body: Leg                Time: 3154-0086 OT Time Calculation (min): 45 min Charges:  OT General Charges $OT Visit: 1 Visit OT Evaluation $OT Eval Moderate Complexity: 1 Mod OT Treatments $Self Care/Home Management : 8-22 mins $Therapeutic Activity: 8-22 mins  Flora Lipps, OTR/L Acute Rehabilitation Services Pager: [phone] Office:  [phone]   Gustave Lindeman C 11/27/2020, 1:47 PM

## 2020-11-28 ENCOUNTER — Other Ambulatory Visit: Payer: Self-pay | Admitting: Internal Medicine

## 2020-11-28 LAB — BASIC METABOLIC PANEL
Anion gap: 9 (ref 5–15)
BUN: 7 mg/dL — ABNORMAL LOW (ref 8–23)
CO2: 28 mmol/L (ref 22–32)
Calcium: 8.7 mg/dL — ABNORMAL LOW (ref 8.9–10.3)
Chloride: 102 mmol/L (ref 98–111)
Creatinine, Ser: 1.03 mg/dL — ABNORMAL HIGH (ref 0.44–1.00)
GFR, Estimated: 58 mL/min — ABNORMAL LOW (ref >60)
Glucose, Bld: 105 mg/dL — ABNORMAL HIGH (ref 70–99)
Potassium: 4.3 mmol/L (ref 3.5–5.1)
Sodium: 139 mmol/L (ref 135–145)

## 2020-11-28 LAB — CBC
HCT: 32.1 % — ABNORMAL LOW (ref 36.0–46.0)
Hemoglobin: 10.5 g/dL — ABNORMAL LOW (ref 12.0–15.0)
MCH: 27.9 pg (ref 26.0–34.0)
MCHC: 32.7 g/dL (ref 30.0–36.0)
MCV: 85.4 fL (ref 80.0–100.0)
Platelets: 210 10*3/uL (ref 150–400)
RBC: 3.76 MIL/uL — ABNORMAL LOW (ref 3.87–5.11)
RDW: 15.8 % — ABNORMAL HIGH (ref 11.5–15.5)
WBC: 5.3 10*3/uL (ref 4.0–10.5)
nRBC: 0 % (ref 0.0–0.2)

## 2020-11-28 MED ORDER — POLYETHYLENE GLYCOL 3350 17 G PO PACK
17.0000 g | PACK | Freq: Every day | ORAL | 0 refills | Status: AC | PRN
Start: 2020-11-28 — End: 2020-12-03

## 2020-11-28 MED ORDER — OXYCODONE HCL 5 MG PO TABS: 5.0000 mg | ORAL_TABLET | Freq: Three times a day (TID) | ORAL | 0 refills | Status: DC | PRN

## 2020-11-28 MED ORDER — OXYCODONE HCL 5 MG PO TABS: 5.0000 mg | ORAL_TABLET | Freq: Three times a day (TID) | ORAL | 0 refills | Status: AC | PRN

## 2020-11-28 MED ORDER — DOXYCYCLINE HYCLATE 50 MG PO CAPS: 100.0000 mg | ORAL_CAPSULE | Freq: Two times a day (BID) | ORAL | 0 refills | Status: AC

## 2020-11-28 MED FILL — POLYETHYLENE GLYCOL 3350 PO: 17 | 14 days supply | Qty: 238 | Fill #0

## 2020-11-28 MED FILL — oxyCODONE HCL 5 MG TABS: 5 | 3 days supply | Qty: 15 | Fill #0

## 2020-11-28 MED FILL — DOXYCYCLINE HYCLATE 100 MG: 100 | 4 days supply | Qty: 8 | Fill #0

## 2020-11-28 NOTE — Progress Notes (Signed)
Nsg Discharge Note  Patient received medications from [facility]. Daughter to transport home.  Admit Date:  11/23/2020 Discharge date: 11/28/2020   Magen Suriano to be D/C'd Home per MD order.  AVS completed.  Copy for chart, and copy for patient signed, and dated. Patient/caregiver able to verbalize understanding.  Discharge Medication: Allergies as of 11/28/2020   No Known Allergies     Medication List    STOP taking these medications   cephALEXin 500 MG capsule Commonly known as: KEFLEX   cyclobenzaprine 10 MG tablet Commonly known as: FLEXERIL   sulfamethoxazole-trimethoprim 800-160 MG tablet Commonly known as: BACTRIM DS   valACYclovir 1000 MG tablet Commonly known as: VALTREX     TAKE these medications   amLODipine-benazepril 5-20 MG capsule Commonly known as: LOTREL Take 1 capsule by mouth daily.   doxycycline 50 MG capsule Commonly known as: VIBRAMYCIN Take 2 capsules (100 mg total) by mouth 2 (two) times daily for 4 days. Start taking on: November 29, 2020   FLUoxetine 40 MG capsule Commonly known as: PROZAC Take 40 mg by mouth daily.   gabapentin 300 MG capsule Commonly known as: NEURONTIN Take 300 mg by mouth 3 (three) times daily.   ofloxacin 0.3 % ophthalmic solution Commonly known as: OCUFLOX   omeprazole 40 MG capsule Commonly known as: PRILOSEC Take 40 mg by mouth daily.   ondansetron 4 MG disintegrating tablet Commonly known as: ZOFRAN-ODT Take 4 mg by mouth every 6 (six) hours as needed.   oxyCODONE 5 MG immediate release tablet Commonly known as: Oxy IR/ROXICODONE Take 1 tablet (5 mg total) by mouth every 8 (eight) hours as needed for up to 5 days for severe pain.   polyethylene glycol 17 g packet Commonly known as: MiraLax Mix-In Pax Take 17 g by mouth daily as needed for up to 5 days for mild constipation (Take while on pain medication to prevent constipation).   rOPINIRole 3 MG tablet Commonly known as: REQUIP Take 3 mg by  mouth at bedtime.   rosuvastatin 20 MG tablet Commonly known as: CRESTOR Take 20 mg by mouth at bedtime.   traMADol 50 MG tablet Commonly known as: ULTRAM Take 50 mg by mouth 4 (four) times daily as needed.   trazodone 300 MG tablet Commonly known as: DESYREL Take 300 mg by mouth at bedtime.            Durable Medical Equipment  (From admission, onward)         Start     Ordered   11/28/20 0612  For home use only DME 3 n 1  Once       Comments: Bariatric   11/28/20 0612   11/28/20 0612  For home use only DME Walker rolling  Once       Comments: Bariatric walker  Question Answer Comment  Walker: With 5 Inch Wheels   Patient needs a walker to treat with the following condition Leg wound, left      11/28/20 0612   11/28/20 0612  For home use only DME Shower stool  Once       Comments: bariatric   11/28/20 1610           Discharge Care Instructions  (From admission, onward)         Start     Ordered   11/28/20 0000  Discharge wound care:       Comments: Follow up with Dr. Lajoyce Corners early next week.   11/28/20 1006  Discharge Assessment: Vitals:   11/28/20 0500 11/28/20 1208  BP: (!) 133/59 133/69  Pulse: 87 87  Resp: 18 16  Temp: 98.8 F (37.1 C) (!) 97.3 F (36.3 C)  SpO2: 92% 96%   Skin clean, dry and intact without evidence of skin break down, no evidence of skin tears noted. IV catheter discontinued intact. Site without signs and symptoms of complications - no redness or edema noted at insertion site, patient denies c/o pain - only slight tenderness at site.  Dressing with slight pressure applied.  D/c Instructions-Education: Discharge instructions given to patient/family with verbalized understanding. D/c education completed with patient/family including follow up instructions, medication list, d/c activities limitations if indicated, with other d/c instructions as indicated by MD - patient able to verbalize understanding, all questions  fully answered. Patient instructed to return to ED, call 911, or call MD for any changes in condition.  Patient escorted via WC, and D/C home via private auto.  Boykin Nearing, RN 11/28/2020 12:29 PM

## 2020-11-28 NOTE — Discharge Summary (Signed)
Name: Nikiya Starn MRN: [id number] DOB: 1951-06-05 70 y.o. PCP: [name], MD  Date of Admission: 11/23/2020  1:20 PM Date of Discharge:  11/28/20 Attending Physician: Earl Lagos, MD  Discharge Diagnosis: 1. Left leg wound  Discharge Medications: Allergies as of 11/28/2020   No Known Allergies     Medication List    STOP taking these medications   cephALEXin 500 MG capsule Commonly known as: KEFLEX   cyclobenzaprine 10 MG tablet Commonly known as: FLEXERIL   sulfamethoxazole-trimethoprim 800-160 MG tablet Commonly known as: BACTRIM DS   valACYclovir 1000 MG tablet Commonly known as: VALTREX     TAKE these medications   amLODipine-benazepril 5-20 MG capsule Commonly known as: LOTREL Take 1 capsule by mouth daily.   doxycycline 50 MG capsule Commonly known as: VIBRAMYCIN Take 2 capsules (100 mg total) by mouth 2 (two) times daily for 4 days. Start taking on: November 29, 2020   FLUoxetine 40 MG capsule Commonly known as: PROZAC Take 40 mg by mouth daily.   gabapentin 300 MG capsule Commonly known as: NEURONTIN Take 300 mg by mouth 3 (three) times daily.   ofloxacin 0.3 % ophthalmic solution Commonly known as: OCUFLOX   omeprazole 40 MG capsule Commonly known as: PRILOSEC Take 40 mg by mouth daily.   ondansetron 4 MG disintegrating tablet Commonly known as: ZOFRAN-ODT Take 4 mg by mouth every 6 (six) hours as needed.   oxyCODONE 5 MG immediate release tablet Commonly known as: Oxy IR/ROXICODONE Take 1 tablet (5 mg total) by mouth every 8 (eight) hours as needed for up to 5 days for severe pain.   polyethylene glycol 17 g packet Commonly known as: MiraLax Mix-In Pax Take 17 g by mouth daily as needed for up to 5 days for mild constipation (Take while on pain medication to prevent constipation).   rOPINIRole 3 MG tablet Commonly known as: REQUIP Take 3 mg by mouth at bedtime.   rosuvastatin 20 MG tablet Commonly known as: CRESTOR Take 20  mg by mouth at bedtime.   traMADol 50 MG tablet Commonly known as: ULTRAM Take 50 mg by mouth 4 (four) times daily as needed.   trazodone 300 MG tablet Commonly known as: DESYREL Take 300 mg by mouth at bedtime.            Durable Medical Equipment  (From admission, onward)         Start     Ordered   11/28/20 0612  For home use only DME 3 n 1  Once       Comments: Bariatric   11/28/20 0612   11/28/20 0612  For home use only DME Walker rolling  Once       Comments: Bariatric walker  Question Answer Comment  Walker: With 5 Inch Wheels   Patient needs a walker to treat with the following condition Leg wound, left      11/28/20 0612   11/28/20 0612  For home use only DME Shower stool  Once       Comments: bariatric   11/28/20 8453           Discharge Care Instructions  (From admission, onward)         Start     Ordered   11/28/20 0000  Discharge wound care:       Comments: Follow up with Dr. Lajoyce Corners early next week.   11/28/20 1006          Disposition and follow-up:  Ms.Lovely Sather was discharged from [facility] in Stable condition.  At the hospital follow up visit please address:  1.  Left leg wound: Follow up with orthopedics early next week to further wound care. May need debridement and skin graft.  PCP:  Assess need for further antibiotics and pain medication  2.  Labs / imaging needed at time of follow-up: none  3.  Pending labs/ test needing follow-up: none  Follow-up Appointments:  Follow-up Information    Nadara Mustard, MD Follow up in 1 week(s).   Specialty: Orthopedic Surgery Contact information: [address] [place] [address] [phone]        [facility] Follow up.   Specialty: Home Health Services Contact information: [address] [place] [address] [phone]        [name], MD Follow up.   Specialty: Internal Medicine Contact information: [address] [place] [address] [phone]              Hospital Course by problem list:  Ms. Sawhney is a 70 year old female with obesity, hypertension, and ischemic vascular disease presents after a fall on 2/2 causes a large skin tear of the left lower extremity. States she felt dizzy after standing up causing her to trip and fall. Presented to [facility] ED for sutures and started on keflex. Sent to ED after follow up with PCP due to concern for infection. Admitted for further wound care.  Wound of left lower extremity Skin tear after falling and tearing the skin of her left leg. Has a history of residual weakness of the left sided from stroke in 1991 making prone to injury on the left. Mechanism of fall likely orthostatic hypotension. Wound with expanding bulla with serosanguinous fluid drainage with necrotic appearing overlying skin. Orthopedic surgery consulted and fluid released. No debridement recommended during admission.  Started on IV vancomycin given erythema and warmth surrounding the wound with improvement. Remained without systemic symptoms. Transitioned to doxycyline to complete 7 days last dose on 2/13. Will need follow up with orthopedics as an outpatient for further wound care, she may need surgical debridement and skin grafting. Will discharge home with HHPT and OT.  Subjective  Feeling well this morning, pain is tolerable. Feels that dressings are tight but able to tolerate this. Discuss plan to go home today and antibiotics and follow up with orthopedics. She is enthusiastic to go home today with daughter.   Discharge Exam:   BP (!) 133/59 (BP Location: Right Wrist)   Pulse 87   Temp 98.8 F (37.1 C) (Oral)   Resp 18   Ht 4' 11.5" (1.511 m)   Wt 104 kg   SpO2 92%   BMI 45.53 kg/m    Discharge exam:   Pertinent Labs, Studies, and Procedures:  EXAM: LEFT TIBIA AND FIBULA - 2 VIEW  COMPARISON:  None.  FINDINGS: There is no evidence of fracture  or other focal bone lesions. Soft tissues are unremarkable.  IMPRESSION: Negative.  Discharge Instructions: Discharge Instructions    Call MD for:  redness, tenderness, or signs of infection (pain, swelling, redness, odor or green/yellow discharge around incision site)   Complete by: As directed    Call MD for:  temperature >100.4   Complete by: As directed    Diet general   Complete by: As directed    Discharge instructions   Complete by: As directed    Dear Ms. Mccandlish,  You were hospitalized for a skin tear of you left leg.   Please take antibiotics starting on 2/10. Marland Kitchen Keep your dressing clean and dry. You will need to follow up with Dr. Audrie Lia office early next week.  I have prescribed oxycodone for pain, you can follow up with you primary care provider Dr. [name] if you are still having significant pain. Please take miralax while taking pain medication to prevent constipation.   Thank you for allowing [place] to be part of your care.   Discharge wound care:   Complete by: As directed    Follow up with Dr. Lajoyce Corners early next week.   Increase activity slowly   Complete by: As directed      Signed: Quincy Simmonds, MD 11/28/2020, 10:07 AM   Pager: [phone]

## 2020-11-28 NOTE — Progress Notes (Signed)
Patient is sitting at bedside.  Profore wraps were placed yesterday.  She is tolerating them but does find them uncomfortable.   Lower extremity wrap is in place.  Appears to be fitting well.  No impingement of skin.  She will follow-up in our office should see [place] within the next week

## 2020-11-28 NOTE — Progress Notes (Signed)
Physical Therapy Treatment Patient Details Name: Linda Mathis MRN: [id number] DOB: [birth year] Today's Date: 11/28/2020    History of Present Illness 70yo female who fell at home and sustained a large left LE wound for which she received 56 sutures at [facility]. She was sent home, later examined by PCP and referred back to ED due to concerns of wound infection. PMH CVA, DM, HTN, HLD, hx achilles tendon repair    PT Comments    Pt progressing towards physical therapy goals. Focus of session was stair training in preparation for discharge. She was able to complete stair training and was able to negotiate stairs with gross min guard assist and railings for support. Pt will have daughter and grandson present to assist her into the house. Pt also educated on car transfer and general safety with home mobility. Will continue to follow until d/c and progress as able per POC. Pt anticipates d/c today.    Follow Up Recommendations  Home health PT     Equipment Recommendations  Rolling walker with 5" wheels;3in1 (PT);Other (comment) (shower bench; needs youth bariatric RW/bariatric equipment overall)    Recommendations for Other Services       Precautions / Restrictions Precautions Precautions: Fall;Other (comment) Precaution Comments: possible orthostatic hypotension, residual L sided weakness from past CVA. L LE wound Restrictions Weight Bearing Restrictions: Yes LLE Weight Bearing: Weight bearing as tolerated    Mobility  Bed Mobility               General bed mobility comments: Pt was received sitting up in the recliner chair.  Transfers Overall transfer level: Needs assistance Equipment used: Rolling walker (2 wheeled) Transfers: Sit to/from Stand Sit to Stand: Supervision         General transfer comment: no physical assist for power up, however close supervision provided for safety.  Ambulation/Gait Ambulation/Gait assistance: Supervision Gait Distance  (Feet): 100 Feet Assistive device: Rolling walker (2 wheeled) Gait Pattern/deviations: Step-through pattern;Decreased step length - right;Decreased stride length;Decreased step length - left;Trunk flexed;Wide base of support Gait velocity: decreased Gait velocity interpretation: <1.8 ft/sec, indicate of risk for recurrent falls General Gait Details: Slow but generally steady with RW for support. Pt reports mild dizziness ~30 feet into gait training, however pt reports it passed after brief standing rest break (~10 seconds).   Stairs Stairs: Yes Stairs assistance: Min guard Stair Management: Two rails;Step to pattern;Forwards Number of Stairs: 3 General stair comments: VC's for sequencing and general safety. Pt powered up 1 step and back down x3 due to IV lines limiting distance up stairs.   Wheelchair Mobility    Modified Rankin (Stroke Patients Only)       Balance Overall balance assessment: Needs assistance;History of Falls Sitting-balance support: Bilateral upper extremity supported;Feet supported Sitting balance-Leahy Scale: Good     Standing balance support: Bilateral upper extremity supported;Single extremity supported;During functional activity Standing balance-Leahy Scale: Fair Standing balance comment: using BUE support and single UE support for washing hands                            Cognition Arousal/Alertness: Awake/alert Behavior During Therapy: WFL for tasks assessed/performed Overall Cognitive Status: Within Functional Limits for tasks assessed                                        Exercises  General Comments        Pertinent Vitals/Pain Pain Assessment: Faces Faces Pain Scale: Hurts little more Pain Location: L LE wound Pain Descriptors / Indicators: Aching;Discomfort;Sore Pain Intervention(s): Limited activity within patient's tolerance;Monitored during session;Repositioned    Home Living                       Prior Function            PT Goals (current goals can now be found in the care plan section) Acute Rehab PT Goals Patient Stated Goal: go home with daughter, heal wound PT Goal Formulation: With patient/family Time For Goal Achievement: 12/10/20 Potential to Achieve Goals: Good Progress towards PT goals: Progressing toward goals    Frequency    Min 3X/week      PT Plan Current plan remains appropriate    Co-evaluation              AM-PAC PT "6 Clicks" Mobility   Outcome Measure  Help needed turning from your back to your side while in a flat bed without using bedrails?: A Little Help needed moving from lying on your back to sitting on the side of a flat bed without using bedrails?: A Lot Help needed moving to and from a bed to a chair (including a wheelchair)?: A Little Help needed standing up from a chair using your arms (e.g., wheelchair or bedside chair)?: A Lot Help needed to walk in hospital room?: A Little Help needed climbing 3-5 steps with a railing? : A Lot 6 Click Score: 15    End of Session Equipment Utilized During Treatment: Gait belt Activity Tolerance: Patient tolerated treatment well Patient left: in chair;with call bell/phone within reach (bed in chair position) Nurse Communication: Mobility status PT Visit Diagnosis: Muscle weakness (generalized) (M62.81);Unsteadiness on feet (R26.81);History of falling (Z91.81);Difficulty in walking, not elsewhere classified (R26.2)     Time: 8144-8185 PT Time Calculation (min) (ACUTE ONLY): 25 min  Charges:  $Gait Training: 23-37 mins                     Linda Mathis, PT, DPT Acute Rehabilitation Services Pager: [phone] Office: [phone]    Linda Mathis 11/28/2020, 12:50 PM

## 2020-11-29 LAB — CULTURE, BLOOD (ROUTINE X 2)
Culture: NO GROWTH
Culture: NO GROWTH
Special Requests: ADEQUATE

## 2020-12-04 ENCOUNTER — Telehealth: Payer: Self-pay | Admitting: Orthopedic Surgery

## 2020-12-04 ENCOUNTER — Ambulatory Visit: Payer: Medicare HMO | Admitting: Physician Assistant

## 2020-12-04 ENCOUNTER — Encounter: Payer: Self-pay | Admitting: Physician Assistant

## 2020-12-04 VITALS — BP 157/80 | HR 93

## 2020-12-04 DIAGNOSIS — S81802D Unspecified open wound, left lower leg, subsequent encounter: Secondary | ICD-10-CM | POA: Diagnosis not present

## 2020-12-04 MED ORDER — HYDROCODONE-ACETAMINOPHEN 5-325 MG PO TABS: 1.0000 | ORAL_TABLET | Freq: Four times a day (QID) | ORAL | 0 refills | Status: DC | PRN

## 2020-12-04 NOTE — Telephone Encounter (Signed)
You saw pt in the office today. Did you want to give verbal ok for PT orders below?

## 2020-12-04 NOTE — Telephone Encounter (Signed)
Linda Mathis-(PT) with [facility] called needing verbal orders for HHPT 2 WK 3 and 1 WK 1. The number to contact Dennie Bible is [phone]

## 2020-12-04 NOTE — Telephone Encounter (Signed)
On hold for now

## 2020-12-04 NOTE — Progress Notes (Signed)
Office Visit Note   Patient: Linda Mathis           Date of Birth: 1951-06-17           MRN: [id number] Visit Date: 12/04/2020              Requested by: [name], MD [address] [place] [place] [place],  [place] [address] PCP: [name], MD  Chief Complaint  Patient presents with  . Left Leg - Wound Check      HPI: Patient is a pleasant 70 year old woman who is accompanied by her daughter today in follow-up for her left leg flap wound.  She was wrapped with a compression wrap prior to her leaving the hospital.  Her daughter states that she has had some trouble with dizziness and she is going to have her see her primary care tomorrow.  Denies any fever or chills.  Assessment & Plan: Visit Diagnoses: No diagnosis found.  Plan: Patient will have another Profore wrapping today.  We will ensure that does not go up into the back of her knee which she found quite uncomfortable.  We will follow-up in 1 week or sooner.  Patient is also requesting a different pain medication.  She finds the oxycodone too strong.  She does have tramadol at home but does not think that is quite enough.  We will try her on Norco to see if this agrees with her more.  Follow-Up Instructions: No follow-ups on file.   Ortho Exam  Patient is alert, oriented, no adenopathy, well-dressed, normal affect, normal respiratory effort.  Blood pressure was 157/80 today.  Patient is alert appropriate to conversation.  Flap wound is sutured with Steri-Strips.  Several Steri-Strips were removed patient did not tolerate removing ones that were entangled in the suture.  The flap itself on the edges appears healthy.  Large blistering area has decreased.  She does not have any necrosis or surrounding erythema.  Moderate soft tissue swelling though she does have wrinkling in her ankle from the wrap.  Compartments are compressible.  Imaging: No results found. No images are attached to the encounter.  Labs: Lab Results  Component  Value Date   REPTSTATUS 11/29/2020 FINAL 11/24/2020   CULT  11/24/2020    NO GROWTH 5 DAYS Performed at [facility], [address] [place], [place] [address]      No results found for: ALBUMIN, PREALBUMIN, LABURIC  No results found for: MG No results found for: VD25OH  No results found for: PREALBUMIN CBC EXTENDED Latest Ref Rng & Units 11/28/2020 11/27/2020 11/26/2020  WBC 4.0 - 10.5 K/uL 5.3 5.1 5.0  RBC 3.87 - 5.11 MIL/uL 3.76(L) 3.98 3.70(L)  HGB 12.0 - 15.0 g/dL 10.5(L) 10.7(L) 10.5(L)  HCT 36.0 - 46.0 % 32.1(L) 34.4(L) 32.0(L)  PLT 150 - 400 K/uL 210 205 194  NEUTROABS 1.7 - 7.7 K/uL - - -  LYMPHSABS 0.7 - 4.0 K/uL - - -     There is no height or weight on file to calculate BMI.  Orders:  No orders of the defined types were placed in this encounter.  Meds ordered this encounter  Medications  . DISCONTD: HYDROcodone-acetaminophen (NORCO) 5-325 MG tablet    Sig: Take 1 tablet by mouth every 6 (six) hours as needed.    Dispense:  30 tablet    Refill:  0  . HYDROcodone-acetaminophen (NORCO) 5-325 MG tablet    Sig: Take 1 tablet by mouth every 6 (six) hours  as needed.    Dispense:  30 tablet    Refill:  0     Procedures: No procedures performed  Clinical Data: No additional findings.  ROS:  All other systems negative, except as noted in the HPI. Review of Systems  Objective: Vital Signs: BP (!) 157/80 (BP Location: Left Arm, Patient Position: Sitting, Cuff Size: Large)   Pulse 93   Specialty Comments:  No specialty comments available.  PMFS History: Patient Active Problem List   Diagnosis Date Noted  . Laceration of left leg 11/25/2020  . Wound of left lower extremity 11/24/2020   Past Medical History:  Diagnosis Date  . CVA (cerebral vascular accident) (HCC)   . Depression   . Diabetes (HCC)   . Family history of adverse reaction to anesthesia   . GERD (gastroesophageal reflux disease)   . HTN (hypertension)   . Hyperlipidemia   .  Left-sided weakness   . RLS (restless legs syndrome)   . Wound of left leg 11/21/2020   MULTIPLE STICHES    No family history on file.  Past Surgical History:  Procedure Laterality Date  . ABDOMINAL HYSTERECTOMY    . ACHILLES TENDON REPAIR     complication of postop wound requiring home health wound care  . CARPAL TUNNEL RELEASE Right   . CHOLECYSTECTOMY    . L4-L5 with Coflex device    . partial nail avulsion medial border hallux Left   . TONSILLECTOMY     Social History   Occupational History  . Not on file  Tobacco Use  . Smoking status: Current Every Day Smoker    Types: Cigarettes  . Smokeless tobacco: Never Used  Vaping Use  . Vaping Use: Never used  Substance and Sexual Activity  . Alcohol use: Not Currently  . Drug use: Never  . Sexual activity: Not on file

## 2020-12-05 NOTE — Telephone Encounter (Signed)
I called and sw PT to advise of message below.

## 2020-12-07 ENCOUNTER — Ambulatory Visit: Payer: Medicare HMO | Admitting: Physician Assistant

## 2020-12-10 ENCOUNTER — Encounter: Payer: Self-pay | Admitting: Orthopedic Surgery

## 2020-12-10 ENCOUNTER — Other Ambulatory Visit: Payer: Self-pay

## 2020-12-10 ENCOUNTER — Ambulatory Visit: Payer: Medicare HMO | Admitting: Physician Assistant

## 2020-12-10 VITALS — Ht 59.5 in | Wt 229.0 lb

## 2020-12-10 DIAGNOSIS — S81802D Unspecified open wound, left lower leg, subsequent encounter: Secondary | ICD-10-CM

## 2020-12-10 NOTE — Progress Notes (Signed)
Office Visit Note   Patient: Gracielynn Birkel           Date of Birth: 01-20-1951           MRN: [id number] Visit Date: 12/10/2020              Requested by: [name], MD [address] [place],  [place] [address] PCP: [name], MD  Chief Complaint  Patient presents with  . Left Leg - Follow-up    Wound check      HPI: Patient presents in follow-up today for her left leg wound.  She just completed her antibiotics.  She has better pain control.  She has been wrapped in a Profore wrap She is also requesting her nails trimmed. Assessment & Plan: Visit Diagnoses: No diagnosis found.  Plan: Patient was seen by Dr. Lajoyce Corners have recommended Iodosorb dressing with wrapping follow-up in 1 week nails were trimmed x9 to healthy surfaces onychomycotic nails. Patient also is aware that her smoking can greatly affect healing of this wound  Follow-Up Instructions: No follow-ups on file.   Ortho Exam  Patient is alert, oriented, no adenopathy, well-dressed, normal affect, normal respiratory effort. Examination left leg sutures were removed peripheral edges look healthy.  She has no cellulitis no ascending cellulitis compartments are soft and some wrinkling.  She does have an area of necrotic tissue over approximately a 4 x 5 cm area laterally.  No foul odor no purulent drainage  Imaging: No results found. No images are attached to the encounter.  Labs: Lab Results  Component Value Date   REPTSTATUS 11/29/2020 FINAL 11/24/2020   CULT  11/24/2020    NO GROWTH 5 DAYS Performed at [facility], [address] [place], [place] [address]      No results found for: ALBUMIN, PREALBUMIN, LABURIC  No results found for: MG No results found for: VD25OH  No results found for: PREALBUMIN CBC EXTENDED Latest Ref Rng & Units 11/28/2020 11/27/2020 11/26/2020  WBC 4.0 - 10.5 K/uL 5.3 5.1 5.0  RBC 3.87 - 5.11 MIL/uL 3.76(L) 3.98 3.70(L)  HGB 12.0 - 15.0 g/dL 10.5(L) 10.7(L) 10.5(L)   HCT 36.0 - 46.0 % 32.1(L) 34.4(L) 32.0(L)  PLT 150 - 400 K/uL 210 205 194  NEUTROABS 1.7 - 7.7 K/uL - - -  LYMPHSABS 0.7 - 4.0 K/uL - - -     Body mass index is 45.48 kg/m.  Orders:  No orders of the defined types were placed in this encounter.  No orders of the defined types were placed in this encounter.    Procedures: No procedures performed  Clinical Data: No additional findings.  ROS:  All other systems negative, except as noted in the HPI. Review of Systems  Objective: Vital Signs: Ht 4' 11.5" (1.511 m)   Wt 229 lb (103.9 kg)   BMI 45.48 kg/m   Specialty Comments:  No specialty comments available.  PMFS History: Patient Active Problem List   Diagnosis Date Noted  . Laceration of left leg 11/25/2020  . Wound of left lower extremity 11/24/2020   Past Medical History:  Diagnosis Date  . CVA (cerebral vascular accident) (HCC)   . Depression   . Diabetes (HCC)   . Family history of adverse reaction to anesthesia   . GERD (gastroesophageal reflux disease)   . HTN (hypertension)   . Hyperlipidemia   . Left-sided weakness   . RLS (restless legs syndrome)   . Wound of left leg 11/21/2020  MULTIPLE STICHES    History reviewed. No pertinent family history.  Past Surgical History:  Procedure Laterality Date  . ABDOMINAL HYSTERECTOMY    . ACHILLES TENDON REPAIR     complication of postop wound requiring home health wound care  . CARPAL TUNNEL RELEASE Right   . CHOLECYSTECTOMY    . L4-L5 with Coflex device    . partial nail avulsion medial border hallux Left   . TONSILLECTOMY     Social History   Occupational History  . Not on file  Tobacco Use  . Smoking status: Current Every Day Smoker    Types: Cigarettes  . Smokeless tobacco: Never Used  Vaping Use  . Vaping Use: Never used  Substance and Sexual Activity  . Alcohol use: Not Currently  . Drug use: Never  . Sexual activity: Not on file

## 2020-12-13 ENCOUNTER — Telehealth: Payer: Self-pay

## 2020-12-13 NOTE — Telephone Encounter (Signed)
Linda Mathis with Linda Mathis would like to know if you have received a fax about holding off on OT for patient?  Stated that they were advised to hold off on PT per Dr. Lajoyce Corners and just wanted to know about OT.  CB#[phone].  Please advise.  Thank you.

## 2020-12-14 NOTE — Telephone Encounter (Signed)
?   Did we rec a fax

## 2020-12-18 ENCOUNTER — Ambulatory Visit: Payer: Medicare HMO | Admitting: Physician Assistant

## 2020-12-18 ENCOUNTER — Encounter: Payer: Self-pay | Admitting: Physician Assistant

## 2020-12-18 DIAGNOSIS — S81812D Laceration without foreign body, left lower leg, subsequent encounter: Secondary | ICD-10-CM

## 2020-12-18 NOTE — Progress Notes (Signed)
Office Visit Note   Patient: Linda Mathis           Date of Birth: Jan 10, 1951           MRN: [id number] Visit Date: 12/18/2020              Requested by: [name], MD [address] [place] [place],  [place] [address] PCP: [name], MD  No chief complaint on file.     HPI: Patient presents in follow-up today status post Profore wrapping of her left lower extremity flap wound.  Her daughter states she did have to wrap the most proximal area of the wound as the compression stocking wrap slid down  Assessment & Plan: Visit Diagnoses: No diagnosis found.  Plan: I do have some concerns she has not really progressed much and she does have some areas of darkness without bleeding.  I think this is about the same as it was last week.  We will have her follow-up in 1 week specifically with Dr. Lajoyce Corners for further planning  Follow-Up Instructions: No follow-ups on file.   Ortho Exam  Patient is alert, oriented, no adenopathy, well-dressed, normal affect, normal respiratory effort. Right lower extremity wound size is unchanged from previous week there is no surrounding cellulitis.  She does have wrinkling in the lower leg and decreased swelling.  She does have a dark area with delaminated necrotic tissue that was debrided today.  In this area I could not stimulate much active bleeding.  She also has fibrinous tissue mixed with a small amount of bleeding throughout the [place] rest of the wound.  Mild foul odor when the wrap was first removed.  Overall I think this wound is stable but has not improved  Imaging: No results found. No images are attached to the encounter.  Labs: Lab Results  Component Value Date   REPTSTATUS 11/29/2020 FINAL 11/24/2020   CULT  11/24/2020    NO GROWTH 5 DAYS Performed at [facility], [address], [place], [place] [address]      No results found for: ALBUMIN, PREALBUMIN, LABURIC  No results found for: MG No results found for: VD25OH  No  results found for: PREALBUMIN CBC EXTENDED Latest Ref Rng & Units 11/28/2020 11/27/2020 11/26/2020  WBC 4.0 - 10.5 K/uL 5.3 5.1 5.0  RBC 3.87 - 5.11 MIL/uL 3.76(L) 3.98 3.70(L)  HGB 12.0 - 15.0 g/dL 10.5(L) 10.7(L) 10.5(L)  HCT 36.0 - 46.0 % 32.1(L) 34.4(L) 32.0(L)  PLT 150 - 400 K/uL 210 205 194  NEUTROABS 1.7 - 7.7 K/uL - - -  LYMPHSABS 0.7 - 4.0 K/uL - - -     There is no height or weight on file to calculate BMI.  Orders:  No orders of the defined types were placed in this encounter.  No orders of the defined types were placed in this encounter.    Procedures: No procedures performed  Clinical Data: No additional findings.  ROS:  All other systems negative, except as noted in the HPI. Review of Systems  Objective: Vital Signs: There were no vitals taken for this visit.  Specialty Comments:  No specialty comments available.  PMFS History: Patient Active Problem List   Diagnosis Date Noted  . Laceration of left leg 11/25/2020  . Wound of left lower extremity 11/24/2020   Past Medical History:  Diagnosis Date  . CVA (cerebral vascular accident) (HCC)   . Depression   . Diabetes (HCC)   . Family history  of adverse reaction to anesthesia   . GERD (gastroesophageal reflux disease)   . HTN (hypertension)   . Hyperlipidemia   . Left-sided weakness   . RLS (restless legs syndrome)   . Wound of left leg 11/21/2020   MULTIPLE STICHES    No family history on file.  Past Surgical History:  Procedure Laterality Date  . ABDOMINAL HYSTERECTOMY    . ACHILLES TENDON REPAIR     complication of postop wound requiring home health wound care  . CARPAL TUNNEL RELEASE Right   . CHOLECYSTECTOMY    . L4-L5 with Coflex device    . partial nail avulsion medial border hallux Left   . TONSILLECTOMY     Social History   Occupational History  . Not on file  Tobacco Use  . Smoking status: Current Every Day Smoker    Types: Cigarettes  . Smokeless tobacco: Never Used  Vaping  Use  . Vaping Use: Never used  Substance and Sexual Activity  . Alcohol use: Not Currently  . Drug use: Never  . Sexual activity: Not on file

## 2020-12-20 NOTE — Telephone Encounter (Signed)
Received signed by Dr. Lajoyce Corners today and faxed back

## 2020-12-24 ENCOUNTER — Ambulatory Visit: Payer: Medicare HMO | Admitting: Orthopedic Surgery

## 2020-12-24 ENCOUNTER — Encounter: Payer: Self-pay | Admitting: Orthopedic Surgery

## 2020-12-24 DIAGNOSIS — S81802D Unspecified open wound, left lower leg, subsequent encounter: Secondary | ICD-10-CM

## 2020-12-24 DIAGNOSIS — S81812D Laceration without foreign body, left lower leg, subsequent encounter: Secondary | ICD-10-CM

## 2020-12-24 MED ORDER — DOXYCYCLINE HYCLATE 100 MG PO TABS: 100.0000 mg | ORAL_TABLET | Freq: Two times a day (BID) | ORAL | 0 refills | Status: DC

## 2020-12-24 NOTE — Progress Notes (Signed)
Office Visit Note   Patient: Linda Mathis           Date of Birth: 01-Apr-1951           MRN: [id number] Visit Date: 12/24/2020              Requested by: [name], MD [address] [place],  [place] [address] PCP: [name], MD  Chief Complaint  Patient presents with  . Left Leg - Pain      HPI: Patient is a 70 year old woman who was seen in follow-up for massive sugar degloving injury to soft tissue left leg she is currently using a rolling walker and tramadol for .    Assessment & Plan: Visit Diagnoses: No diagnosis found.  Plan: Continue with the compression wraps change Thursday and Monday.  A prescription called in for doxycycline.  Follow-Up Instructions: Return in about 1 week (around 12/31/2020) for Follow-up Thursday and Monday.[name] Exam  Patient is alert, oriented, no adenopathy, well-dressed, normal affect, normal respiratory effort. Examination patient does have good bleeding granulation tissue the wound was debrided with a 4 x 4 gauze to remove the fibrinous exudative tissue silver nitrate was used for hemostasis.  She has a strong dorsalis pedis pulse.  The wound encompasses the dorsal lateral left calf.  Imaging: No results found. No images are attached to the encounter.  Labs: Lab Results  Component Value Date   REPTSTATUS 11/29/2020 FINAL 11/24/2020   CULT  11/24/2020    NO GROWTH 5 DAYS Performed at [facility], [address] [place], [place] [address]      No results found for: ALBUMIN, PREALBUMIN, LABURIC  No results found for: MG No results found for: VD25OH  No results found for: PREALBUMIN CBC EXTENDED Latest Ref Rng & Units 11/28/2020 11/27/2020 11/26/2020  WBC 4.0 - 10.5 K/uL 5.3 5.1 5.0  RBC 3.87 - 5.11 MIL/uL 3.76(L) 3.98 3.70(L)  HGB 12.0 - 15.0 g/dL 10.5(L) 10.7(L) 10.5(L)  HCT 36.0 - 46.0 % 32.1(L) 34.4(L) 32.0(L)  PLT 150 - 400 K/uL 210 205 194  NEUTROABS 1.7 - 7.7 K/uL - - -  LYMPHSABS 0.7 - 4.0 K/uL -  - -     There is no height or weight on file to calculate BMI.  Orders:  No orders of the defined types were placed in this encounter.  Meds ordered this encounter  Medications  . doxycycline (VIBRA-TABS) 100 MG tablet    Sig: Take 1 tablet (100 mg total) by mouth 2 (two) times daily.    Dispense:  60 tablet    Refill:  0     Procedures: No procedures performed  Clinical Data: No additional findings.  ROS:  All other systems negative, except as noted in the HPI. Review of Systems  Objective: Vital Signs: There were no vitals taken for this visit.  Specialty Comments:  No specialty comments available.  PMFS History: Patient Active Problem List   Diagnosis Date Noted  . Laceration of left leg 11/25/2020  . Wound of left lower extremity 11/24/2020   Past Medical History:  Diagnosis Date  . CVA (cerebral vascular accident) (HCC)   . Depression   . Diabetes (HCC)   . Family history of adverse reaction to anesthesia   . GERD (gastroesophageal reflux disease)   . HTN (hypertension)   . Hyperlipidemia   . Left-sided weakness   . RLS (restless legs syndrome)   . Wound of left leg  11/21/2020   MULTIPLE STICHES    History reviewed. No pertinent family history.  Past Surgical History:  Procedure Laterality Date  . ABDOMINAL HYSTERECTOMY    . ACHILLES TENDON REPAIR     complication of postop wound requiring home health wound care  . CARPAL TUNNEL RELEASE Right   . CHOLECYSTECTOMY    . L4-L5 with Coflex device    . partial nail avulsion medial border hallux Left   . TONSILLECTOMY     Social History   Occupational History  . Not on file  Tobacco Use  . Smoking status: Current Every Day Smoker    Types: Cigarettes  . Smokeless tobacco: Never Used  Vaping Use  . Vaping Use: Never used  Substance and Sexual Activity  . Alcohol use: Not Currently  . Drug use: Never  . Sexual activity: Not on file

## 2020-12-27 ENCOUNTER — Ambulatory Visit: Payer: Medicare HMO | Admitting: Podiatry

## 2020-12-27 ENCOUNTER — Ambulatory Visit: Payer: Medicare HMO | Admitting: Orthopedic Surgery

## 2020-12-27 ENCOUNTER — Encounter: Payer: Self-pay | Admitting: Orthopedic Surgery

## 2020-12-27 ENCOUNTER — Ambulatory Visit: Payer: Medicare HMO | Admitting: Physician Assistant

## 2020-12-27 DIAGNOSIS — S81812D Laceration without foreign body, left lower leg, subsequent encounter: Secondary | ICD-10-CM

## 2020-12-27 NOTE — Progress Notes (Signed)
Office Visit Note   Patient: Linda Mathis           Date of Birth: 09-15-1951           MRN: [id number] Visit Date: 12/27/2020              Requested by: [name], MD [address] [place],  [place] [address] PCP: [name], MD  Chief Complaint  Patient presents with  . Left Leg - Follow-up      HPI: Patient is a pleasant 70 year old woman who is seen in follow-up today for her degloving injury to soft tissue of the left leg.  She is here for a Profore dressing change.  Assessment & Plan: Visit Diagnoses: No diagnosis found.  Plan: Profore dressing will be changed today.  Emphasized the importance of elevating her leg above heart level and discussed with this looks like.  Follow-up on Monday  Follow-Up Instructions: No follow-ups on file.   Ortho Exam  Patient is alert, oriented, no adenopathy, well-dressed, normal affect, normal respiratory effort. Examination patient has bleeding granulation tissue she has a good dorsalis pedis pulse there is no ascending cellulitis.  Wound is about the same in size on the dorsal calf.  She does have some healthy bleeding she does have some soft tissue swelling and the foot but no cellulitis  Imaging: No results found. No images are attached to the encounter.  Labs: Lab Results  Component Value Date   REPTSTATUS 11/29/2020 FINAL 11/24/2020   CULT  11/24/2020    NO GROWTH 5 DAYS Performed at [facility], [address] [place], [place] [address]      No results found for: ALBUMIN, PREALBUMIN, LABURIC  No results found for: MG No results found for: VD25OH  No results found for: PREALBUMIN CBC EXTENDED Latest Ref Rng & Units 11/28/2020 11/27/2020 11/26/2020  WBC 4.0 - 10.5 K/uL 5.3 5.1 5.0  RBC 3.87 - 5.11 MIL/uL 3.76(L) 3.98 3.70(L)  HGB 12.0 - 15.0 g/dL 10.5(L) 10.7(L) 10.5(L)  HCT 36.0 - 46.0 % 32.1(L) 34.4(L) 32.0(L)  PLT 150 - 400 K/uL 210 205 194  NEUTROABS 1.7 - 7.7 K/uL - - -  LYMPHSABS 0.7 - 4.0  K/uL - - -     There is no height or weight on file to calculate BMI.  Orders:  No orders of the defined types were placed in this encounter.  No orders of the defined types were placed in this encounter.    Procedures: No procedures performed  Clinical Data: No additional findings.  ROS:  All other systems negative, except as noted in the HPI. Review of Systems  Objective: Vital Signs: There were no vitals taken for this visit.  Specialty Comments:  No specialty comments available.  PMFS History: Patient Active Problem List   Diagnosis Date Noted  . Laceration of left leg 11/25/2020  . Wound of left lower extremity 11/24/2020   Past Medical History:  Diagnosis Date  . CVA (cerebral vascular accident) (HCC)   . Depression   . Diabetes (HCC)   . Family history of adverse reaction to anesthesia   . GERD (gastroesophageal reflux disease)   . HTN (hypertension)   . Hyperlipidemia   . Left-sided weakness   . RLS (restless legs syndrome)   . Wound of left leg 11/21/2020   MULTIPLE STICHES    No family history on file.  Past Surgical History:  Procedure Laterality Date  . ABDOMINAL HYSTERECTOMY    .  ACHILLES TENDON REPAIR     complication of postop wound requiring home health wound care  . CARPAL TUNNEL RELEASE Right   . CHOLECYSTECTOMY    . L4-L5 with Coflex device    . partial nail avulsion medial border hallux Left   . TONSILLECTOMY     Social History   Occupational History  . Not on file  Tobacco Use  . Smoking status: Current Every Day Smoker    Types: Cigarettes  . Smokeless tobacco: Never Used  Vaping Use  . Vaping Use: Never used  Substance and Sexual Activity  . Alcohol use: Not Currently  . Drug use: Never  . Sexual activity: Not on file

## 2020-12-31 ENCOUNTER — Encounter: Payer: Self-pay | Admitting: Orthopedic Surgery

## 2020-12-31 ENCOUNTER — Other Ambulatory Visit: Payer: Self-pay

## 2020-12-31 ENCOUNTER — Ambulatory Visit: Payer: Medicare HMO | Admitting: Physician Assistant

## 2020-12-31 VITALS — Ht 59.5 in | Wt 229.0 lb

## 2020-12-31 DIAGNOSIS — S81812D Laceration without foreign body, left lower leg, subsequent encounter: Secondary | ICD-10-CM

## 2020-12-31 NOTE — Progress Notes (Signed)
Office Visit Note   Patient: Linda Mathis           Date of Birth: [birth year]           MRN: [id number] Visit Date: 12/31/2020              Requested by: [name], MD [address] [place],  [place] [address] PCP: [name], MD  Chief Complaint  Patient presents with  . Left Leg - Follow-up      HPI: Patient presents in follow-up today for her left leg degloving injury.  She has been getting Profore wraps.  Her daughter thinks she is definitely improving.  Assessment & Plan: Visit Diagnoses: No diagnosis found.  Plan: Patient still has quite a bit of swelling at the proximal calf.  I am hoping 1 more wrap and then we could measure her on Thursday for compression stockings.  Follow-Up Instructions: No follow-ups on file.   Ortho Exam  Patient is alert, oriented, no adenopathy, well-dressed, normal affect, normal respiratory effort. Focused examination demonstrates degloving injury with epithelialization from original markers.  There is some brisk bleeding over 95% of the wound with good healthy tissue.  Still quite a bit of swelling over the proximal tibia but no ascending cellulitis mild to moderate soft tissue swelling in the foot  Imaging: No results found. No images are attached to the encounter.  Labs: Lab Results  Component Value Date   REPTSTATUS 11/29/2020 FINAL 11/24/2020   CULT  11/24/2020    NO GROWTH 5 DAYS Performed at [facility], [address] [place], [place] [address]      No results found for: ALBUMIN, PREALBUMIN, LABURIC  No results found for: MG No results found for: VD25OH  No results found for: PREALBUMIN CBC EXTENDED Latest Ref Rng & Units 11/28/2020 11/27/2020 11/26/2020  WBC 4.0 - 10.5 K/uL 5.3 5.1 5.0  RBC 3.87 - 5.11 MIL/uL 3.76(L) 3.98 3.70(L)  HGB 12.0 - 15.0 g/dL 10.5(L) 10.7(L) 10.5(L)  HCT 36.0 - 46.0 % 32.1(L) 34.4(L) 32.0(L)  PLT 150 - 400 K/uL 210 205 194  NEUTROABS 1.7 - 7.7 K/uL - - -  LYMPHSABS 0.7 -  4.0 K/uL - - -     Body mass index is 45.48 kg/m.  Orders:  No orders of the defined types were placed in this encounter.  No orders of the defined types were placed in this encounter.    Procedures: No procedures performed  Clinical Data: No additional findings.  ROS:  All other systems negative, except as noted in the HPI. Review of Systems  Objective: Vital Signs: Ht 4' 11.5" (1.511 m)   Wt 229 lb (103.9 kg)   BMI 45.48 kg/m   Specialty Comments:  No specialty comments available.  PMFS History: Patient Active Problem List   Diagnosis Date Noted  . Laceration of left leg 11/25/2020  . Wound of left lower extremity 11/24/2020   Past Medical History:  Diagnosis Date  . CVA (cerebral vascular accident) (HCC)   . Depression   . Diabetes (HCC)   . Family history of adverse reaction to anesthesia   . GERD (gastroesophageal reflux disease)   . HTN (hypertension)   . Hyperlipidemia   . Left-sided weakness   . RLS (restless legs syndrome)   . Wound of left leg 11/21/2020   MULTIPLE STICHES    History reviewed. No pertinent family history.  Past Surgical History:  Procedure Laterality Date  . ABDOMINAL HYSTERECTOMY    .  ACHILLES TENDON REPAIR     complication of postop wound requiring home health wound care  . CARPAL TUNNEL RELEASE Right   . CHOLECYSTECTOMY    . L4-L5 with Coflex device    . partial nail avulsion medial border hallux Left   . TONSILLECTOMY     Social History   Occupational History  . Not on file  Tobacco Use  . Smoking status: Current Every Day Smoker    Types: Cigarettes  . Smokeless tobacco: Never Used  Vaping Use  . Vaping Use: Never used  Substance and Sexual Activity  . Alcohol use: Not Currently  . Drug use: Never  . Sexual activity: Not on file

## 2021-01-03 ENCOUNTER — Encounter: Payer: Self-pay | Admitting: Orthopedic Surgery

## 2021-01-03 ENCOUNTER — Other Ambulatory Visit: Payer: Self-pay

## 2021-01-03 ENCOUNTER — Ambulatory Visit (INDEPENDENT_AMBULATORY_CARE_PROVIDER_SITE_OTHER): Payer: Medicare HMO | Admitting: Orthopedic Surgery

## 2021-01-03 DIAGNOSIS — S81812D Laceration without foreign body, left lower leg, subsequent encounter: Secondary | ICD-10-CM

## 2021-01-03 DIAGNOSIS — S81802D Unspecified open wound, left lower leg, subsequent encounter: Secondary | ICD-10-CM | POA: Diagnosis not present

## 2021-01-03 NOTE — Progress Notes (Signed)
Office Visit Note   Patient: Linda Mathis           Date of Birth: Dec 22, 1950           MRN: [id number] Visit Date: 01/03/2021              Requested by: [name], MD [address] [place],  [place] [address] PCP: [name], MD  Chief Complaint  Patient presents with  . Left Leg - Follow-up      HPI: Patient is a 70 year old woman who presents in follow-up status post large avulsion shear injury of the skin of the left leg she has undergone serial compression wraps and has significant decreased swelling and improved epithelization.  Assessment & Plan: Visit Diagnoses:  1. Laceration of left lower extremity, subsequent encounter   2. Wound of left lower extremity, subsequent encounter     Plan: Will transition to the knee-high medical compression stocking she is given a sample of the 2 extra-large and will obtain extra socks wash her leg daily with soap and water change the sock daily.  Discussed the importance of elevating her foot.  Follow-Up Instructions: Return in about 2 weeks (around 01/17/2021).   Ortho Exam  Patient is alert, oriented, no adenopathy, well-dressed, normal affect, normal respiratory effort. Examination patient has approximately 50% epithelialization of the large traumatic avulsion shear injury of the left leg there is no cellulitis she is completing her course of antibiotics there is minimal drainage no signs of infection the wound bed has approximately 90% healthy granulation tissue.  Patient's calf measures 49 cm in circumference a 2 extra-large compression sock was applied.  She states this felt good.  Imaging: No results found.   Labs: Lab Results  Component Value Date   REPTSTATUS 11/29/2020 FINAL 11/24/2020   CULT  11/24/2020    NO GROWTH 5 DAYS Performed at [facility], [address], [place], [place] [address]      No results found for: ALBUMIN, PREALBUMIN, LABURIC  No results found for: MG No results found for:  VD25OH  No results found for: PREALBUMIN CBC EXTENDED Latest Ref Rng & Units 11/28/2020 11/27/2020 11/26/2020  WBC 4.0 - 10.5 K/uL 5.3 5.1 5.0  RBC 3.87 - 5.11 MIL/uL 3.76(L) 3.98 3.70(L)  HGB 12.0 - 15.0 g/dL 10.5(L) 10.7(L) 10.5(L)  HCT 36.0 - 46.0 % 32.1(L) 34.4(L) 32.0(L)  PLT 150 - 400 K/uL 210 205 194  NEUTROABS 1.7 - 7.7 K/uL - - -  LYMPHSABS 0.7 - 4.0 K/uL - - -     There is no height or weight on file to calculate BMI.  Orders:  No orders of the defined types were placed in this encounter.  No orders of the defined types were placed in this encounter.    Procedures: No procedures performed  Clinical Data: No additional findings.  ROS:  All other systems negative, except as noted in the HPI. Review of Systems  Objective: Vital Signs: There were no vitals taken for this visit.  Specialty Comments:  No specialty comments available.  PMFS History: Patient Active Problem List   Diagnosis Date Noted  . Laceration of left leg 11/25/2020  . Wound of left lower extremity 11/24/2020   Past Medical History:  Diagnosis Date  . CVA (cerebral vascular accident) (HCC)   . Depression   . Diabetes (HCC)   . Family history of adverse reaction to anesthesia   . GERD (gastroesophageal reflux disease)   . HTN (  hypertension)   . Hyperlipidemia   . Left-sided weakness   . RLS (restless legs syndrome)   . Wound of left leg 11/21/2020   MULTIPLE STICHES    History reviewed. No pertinent family history.  Past Surgical History:  Procedure Laterality Date  . ABDOMINAL HYSTERECTOMY    . ACHILLES TENDON REPAIR     complication of postop wound requiring home health wound care  . CARPAL TUNNEL RELEASE Right   . CHOLECYSTECTOMY    . L4-L5 with Coflex device    . partial nail avulsion medial border hallux Left   . TONSILLECTOMY     Social History   Occupational History  . Not on file  Tobacco Use  . Smoking status: Current Every Day Smoker    Types: Cigarettes  .  Smokeless tobacco: Never Used  Vaping Use  . Vaping Use: Never used  Substance and Sexual Activity  . Alcohol use: Not Currently  . Drug use: Never  . Sexual activity: Not on file

## 2021-01-07 ENCOUNTER — Ambulatory Visit: Payer: Medicare HMO | Admitting: Orthopedic Surgery

## 2021-01-10 ENCOUNTER — Ambulatory Visit: Payer: Medicare HMO | Admitting: Orthopedic Surgery

## 2021-01-14 ENCOUNTER — Other Ambulatory Visit: Payer: Self-pay | Admitting: Orthopedic Surgery

## 2021-01-17 ENCOUNTER — Encounter: Payer: Self-pay | Admitting: Orthopedic Surgery

## 2021-01-17 ENCOUNTER — Ambulatory Visit (INDEPENDENT_AMBULATORY_CARE_PROVIDER_SITE_OTHER): Payer: Medicare HMO | Admitting: Orthopedic Surgery

## 2021-01-17 DIAGNOSIS — S81812D Laceration without foreign body, left lower leg, subsequent encounter: Secondary | ICD-10-CM | POA: Diagnosis not present

## 2021-01-17 NOTE — Progress Notes (Signed)
Office Visit Note   Patient: Linda Mathis           Date of Birth: 1951/07/18           MRN: [id number] Visit Date: 01/17/2021              Requested by: [name], MD [address] [place],  [place] [address] PCP: [name], MD  Chief Complaint  Patient presents with  . Left Leg - Follow-up      HPI: Patient is a 70 year old woman is seen in follow-up for a large skin tear involving the left leg she is currently wearing medical compression stockings she inquires whether she is to wear than 24 hours on and 24 hours off.  Assessment & Plan: Visit Diagnoses:  1. Laceration of left lower extremity, subsequent encounter     Plan: Patient is instructed to wear the socks 24 hours a day she is to take them off to bathe and shower and apply a new sock.  Follow-Up Instructions: Return in about 3 weeks (around 02/07/2021).   Ortho Exam  Patient is alert, oriented, no adenopathy, well-dressed, normal affect, normal respiratory effort. Examination patient has shown excellent improvement in the wound bed it has 100% healthy granulation tissue but continues to epithelialize the wound now measures 2 x 15 cm there is no redness no cellulitis no signs of infection.  There was a piece of skin that she partially tore off this was debrided and touched with silver nitrate.  Instructed the importance of not pulling off any scabs or skin tags.  Imaging: No results found. No images are attached to the encounter.  Labs: Lab Results  Component Value Date   REPTSTATUS 11/29/2020 FINAL 11/24/2020   CULT  11/24/2020    NO GROWTH 5 DAYS Performed at [facility], [address], [place], [place] [address]      No results found for: ALBUMIN, PREALBUMIN, CBC  No results found for: MG No results found for: VD25OH  No results found for: PREALBUMIN CBC EXTENDED Latest Ref Rng & Units 11/28/2020 11/27/2020 11/26/2020  WBC 4.0 - 10.5 K/uL 5.3 5.1 5.0  RBC 3.87 - 5.11 MIL/uL 3.76(L)  3.98 3.70(L)  HGB 12.0 - 15.0 g/dL 10.5(L) 10.7(L) 10.5(L)  HCT 36.0 - 46.0 % 32.1(L) 34.4(L) 32.0(L)  PLT 150 - 400 K/uL 210 205 194  NEUTROABS 1.7 - 7.7 K/uL - - -  LYMPHSABS 0.7 - 4.0 K/uL - - -     There is no height or weight on file to calculate BMI.  Orders:  No orders of the defined types were placed in this encounter.  No orders of the defined types were placed in this encounter.    Procedures: No procedures performed  Clinical Data: No additional findings.  ROS:  All other systems negative, except as noted in the HPI. Review of Systems  Objective: Vital Signs: There were no vitals taken for this visit.  Specialty Comments:  No specialty comments available.  PMFS History: Patient Active Problem List   Diagnosis Date Noted  . Laceration of left leg 11/25/2020  . Wound of left lower extremity 11/24/2020   Past Medical History:  Diagnosis Date  . CVA (cerebral vascular accident) (HCC)   . Depression   . Diabetes (HCC)   . Family history of adverse reaction to anesthesia   . GERD (gastroesophageal reflux disease)   . HTN (hypertension)   . Hyperlipidemia   . Left-sided weakness   .  RLS (restless legs syndrome)   . Wound of left leg 11/21/2020   MULTIPLE STICHES    History reviewed. No pertinent family history.  Past Surgical History:  Procedure Laterality Date  . ABDOMINAL HYSTERECTOMY    . ACHILLES TENDON REPAIR     complication of postop wound requiring home health wound care  . CARPAL TUNNEL RELEASE Right   . CHOLECYSTECTOMY    . L4-L5 with Coflex device    . partial nail avulsion medial border hallux Left   . TONSILLECTOMY     Social History   Occupational History  . Not on file  Tobacco Use  . Smoking status: Current Every Day Smoker    Types: Cigarettes  . Smokeless tobacco: Never Used  Vaping Use  . Vaping Use: Never used  Substance and Sexual Activity  . Alcohol use: Not Currently  . Drug use: Never  . Sexual activity: Not on  file

## 2021-01-31 ENCOUNTER — Ambulatory Visit: Payer: Medicare HMO | Admitting: Physician Assistant

## 2021-02-07 ENCOUNTER — Ambulatory Visit (INDEPENDENT_AMBULATORY_CARE_PROVIDER_SITE_OTHER): Payer: Medicare HMO | Admitting: Physician Assistant

## 2021-02-07 ENCOUNTER — Encounter: Payer: Self-pay | Admitting: Orthopedic Surgery

## 2021-02-07 DIAGNOSIS — S81812D Laceration without foreign body, left lower leg, subsequent encounter: Secondary | ICD-10-CM

## 2021-02-07 NOTE — Progress Notes (Signed)
Office Visit Note   Patient: Linda Mathis           Date of Birth: October 24, 1950           MRN: [id number] Visit Date: 02/07/2021              Requested by: [name], MD [address] [place],  [place] [address] PCP: [name], MD  Chief Complaint  Patient presents with  . Left Leg - Follow-up    LLE skin tear       HPI: Patient follows up for her left lower extremity laceration.  She is wearing her compression socks and has made excellent progress.  Her daughter accompanies her.  She is currently waiting to be evaluated by neurology as she has had multiple falls.  She says her legs "give out on her ".  She has been seen by her primary care who referred her to a neurologist.  One of the falls did produce in tear on her left arm  Assessment & Plan: Visit Diagnoses: No diagnosis found.  Plan: Continue with socks.  Follow-up 1 month.  Emphasized the importance of not moving around much when no one is home and of having a good way to contact family should she fall.  She is a high risk.  Follow-Up Instructions: No follow-ups on file.   Ortho Exam  Patient is alert, oriented, no adenopathy, well-dressed, normal affect, normal respiratory effort. She has good epithelialization around the wound no surrounding cellulitis she has excellent vascular wound bed wound continues to shrink in size.  No ascending cellulitis or signs of infection Left arm she has a skin tear with just a minimal amount of drainage.  No tenderness to palpation over the bone.  No cellulitis no signs of infection  Imaging: No results found. No images are attached to the encounter.  Labs: Lab Results  Component Value Date   REPTSTATUS 11/29/2020 FINAL 11/24/2020   CULT  11/24/2020    NO GROWTH 5 DAYS Performed at [facility], [address] [place], [place] [address]      No results found for: ALBUMIN, PREALBUMIN, CBC  No results found for: MG No results found for: VD25OH  No  results found for: PREALBUMIN CBC EXTENDED Latest Ref Rng & Units 11/28/2020 11/27/2020 11/26/2020  WBC 4.0 - 10.5 K/uL 5.3 5.1 5.0  RBC 3.87 - 5.11 MIL/uL 3.76(L) 3.98 3.70(L)  HGB 12.0 - 15.0 g/dL 10.5(L) 10.7(L) 10.5(L)  HCT 36.0 - 46.0 % 32.1(L) 34.4(L) 32.0(L)  PLT 150 - 400 K/uL 210 205 194  NEUTROABS 1.7 - 7.7 K/uL - - -  LYMPHSABS 0.7 - 4.0 K/uL - - -     There is no height or weight on file to calculate BMI.  Orders:  No orders of the defined types were placed in this encounter.  No orders of the defined types were placed in this encounter.    Procedures: No procedures performed  Clinical Data: No additional findings.  ROS:  All other systems negative, except as noted in the HPI. Review of Systems  Objective: Vital Signs: There were no vitals taken for this visit.  Specialty Comments:  No specialty comments available.  PMFS History: Patient Active Problem List   Diagnosis Date Noted  . Laceration of left leg 11/25/2020  . Wound of left lower extremity 11/24/2020   Past Medical History:  Diagnosis Date  . CVA (cerebral vascular accident) (HCC)   . Depression   .  Diabetes (HCC)   . Family history of adverse reaction to anesthesia   . GERD (gastroesophageal reflux disease)   . HTN (hypertension)   . Hyperlipidemia   . Left-sided weakness   . RLS (restless legs syndrome)   . Wound of left leg 11/21/2020   MULTIPLE STICHES    No family history on file.  Past Surgical History:  Procedure Laterality Date  . ABDOMINAL HYSTERECTOMY    . ACHILLES TENDON REPAIR     complication of postop wound requiring home health wound care  . CARPAL TUNNEL RELEASE Right   . CHOLECYSTECTOMY    . L4-L5 with Coflex device    . partial nail avulsion medial border hallux Left   . TONSILLECTOMY     Social History   Occupational History  . Not on file  Tobacco Use  . Smoking status: Current Every Day Smoker    Types: Cigarettes  . Smokeless tobacco: Never Used  Vaping  Use  . Vaping Use: Never used  Substance and Sexual Activity  . Alcohol use: Not Currently  . Drug use: Never  . Sexual activity: Not on file

## 2021-03-06 ENCOUNTER — Encounter: Payer: Self-pay | Admitting: Physician Assistant

## 2021-03-06 ENCOUNTER — Ambulatory Visit: Payer: Medicare HMO | Admitting: Orthopaedic Surgery

## 2021-03-06 ENCOUNTER — Ambulatory Visit (INDEPENDENT_AMBULATORY_CARE_PROVIDER_SITE_OTHER): Payer: Medicare HMO

## 2021-03-06 ENCOUNTER — Ambulatory Visit (INDEPENDENT_AMBULATORY_CARE_PROVIDER_SITE_OTHER): Payer: Medicare HMO | Admitting: Physician Assistant

## 2021-03-06 DIAGNOSIS — M25562 Pain in left knee: Secondary | ICD-10-CM

## 2021-03-06 NOTE — Progress Notes (Signed)
Office Visit Note   Patient: Linda Mathis           Date of Birth: [birth year]           MRN: [id number] Visit Date: 03/06/2021              Requested by: [name], MD [address] [place],  [place] [address] PCP: [name], MD  No chief complaint on file.     HPI: Patient presents today for follow-up on her left leg wound.  Her daughter has been applying her compression socks daily.  They have an appointment scheduled with neurology as her primary care doctor wants her to be evaluated for continued unsteadiness and falls.  Her most recent fall was onto her left knee.  She points to pain around the medial joint line.  She describes it as a twisting injury and heard a pop  Assessment & Plan: Visit Diagnoses:  1. Acute pain of left knee     Plan: Findings consistent with a medial meniscus tear also probably an MCL sprain.  Discussed the natural history of this I could give her an injection today but I am concerned that she would initially have even more unsteadiness than she currently has and her daughter would like to avoid this right now.  We will follow-up in 2 months for reevaluation of the wound  Follow-Up Instructions: No follow-ups on file.   Ortho Exam  Patient is alert, oriented, no adenopathy, well-dressed, normal affect, normal respiratory effort. Wound has almost completely healed there is a couple spots of eschar.  No surrounding cellulitis no erythema no signs of infection  Imaging: XR Knee 1-2 Views Left  Result Date: 03/06/2021 Views of her left knee demonstrate some varus alignment.  No acute osseous injury she does have some joint space narrowing in the medial compartment  No images are attached to the encounter.  Labs: Lab Results  Component Value Date   REPTSTATUS 11/29/2020 FINAL 11/24/2020   CULT  11/24/2020    NO GROWTH 5 DAYS Performed at [facility], [address] [place], [place] [address]      No results found for:  ALBUMIN, PREALBUMIN, CBC  No results found for: MG No results found for: VD25OH  No results found for: PREALBUMIN CBC EXTENDED Latest Ref Rng & Units 11/28/2020 11/27/2020 11/26/2020  WBC 4.0 - 10.5 K/uL 5.3 5.1 5.0  RBC 3.87 - 5.11 MIL/uL 3.76(L) 3.98 3.70(L)  HGB 12.0 - 15.0 g/dL 10.5(L) 10.7(L) 10.5(L)  HCT 36.0 - 46.0 % 32.1(L) 34.4(L) 32.0(L)  PLT 150 - 400 K/uL 210 205 194  NEUTROABS 1.7 - 7.7 K/uL - - -  LYMPHSABS 0.7 - 4.0 K/uL - - -     There is no height or weight on file to calculate BMI.  Orders:  Orders Placed This Encounter  Procedures  . XR Knee 1-2 Views Left   No orders of the defined types were placed in this encounter.    Procedures: No procedures performed  Clinical Data: No additional findings.  ROS:  All other systems negative, except as noted in the HPI. Review of Systems  Objective: Vital Signs: There were no vitals taken for this visit.  Specialty Comments:  No specialty comments available.  PMFS History: Patient Active Problem List   Diagnosis Date Noted  . Laceration of left leg 11/25/2020  . Wound of left lower extremity 11/24/2020   Past Medical History:  Diagnosis Date  .  CVA (cerebral vascular accident) (HCC)   . Depression   . Diabetes (HCC)   . Family history of adverse reaction to anesthesia   . GERD (gastroesophageal reflux disease)   . HTN (hypertension)   . Hyperlipidemia   . Left-sided weakness   . RLS (restless legs syndrome)   . Wound of left leg 11/21/2020   MULTIPLE STICHES    No family history on file.  Past Surgical History:  Procedure Laterality Date  . ABDOMINAL HYSTERECTOMY    . ACHILLES TENDON REPAIR     complication of postop wound requiring home health wound care  . CARPAL TUNNEL RELEASE Right   . CHOLECYSTECTOMY    . L4-L5 with Coflex device    . partial nail avulsion medial border hallux Left   . TONSILLECTOMY     Social History   Occupational History  . Not on file  Tobacco Use  . Smoking  status: Current Every Day Smoker    Types: Cigarettes  . Smokeless tobacco: Never Used  Vaping Use  . Vaping Use: Never used  Substance and Sexual Activity  . Alcohol use: Not Currently  . Drug use: Never  . Sexual activity: Not on file

## 2021-03-28 ENCOUNTER — Encounter: Payer: Self-pay | Admitting: Podiatry

## 2021-03-28 ENCOUNTER — Ambulatory Visit: Payer: Medicare HMO | Admitting: Podiatry

## 2021-03-28 ENCOUNTER — Other Ambulatory Visit: Payer: Self-pay

## 2021-03-28 DIAGNOSIS — M79674 Pain in right toe(s): Secondary | ICD-10-CM

## 2021-03-28 DIAGNOSIS — B351 Tinea unguium: Secondary | ICD-10-CM

## 2021-03-28 DIAGNOSIS — M79675 Pain in left toe(s): Secondary | ICD-10-CM | POA: Diagnosis not present

## 2021-03-28 DIAGNOSIS — L89620 Pressure ulcer of left heel, unstageable: Secondary | ICD-10-CM

## 2021-03-28 DIAGNOSIS — E119 Type 2 diabetes mellitus without complications: Secondary | ICD-10-CM

## 2021-03-28 NOTE — Patient Instructions (Signed)
Purchase heel protectors/heel pillows for both feet on [employer]. Wear them whenever you are in bed or in the recliner. Do not attempt to walk with them on as you could slip and fall.  Pressure Injury  A pressure injury is damage to the skin and underlying tissue that results from pressure being applied to an area of the body. It often affects people who must spend a long time in a bed or chair because of a medical condition. Pressure injuries usually occur: Over bony parts of the body, such as the tailbone, shoulders, elbows, hips, heels, spine, ankles, and back of the head. Under medical devices that make contact with the body, such as respiratory equipment, stockings, tubes, and splints. Pressure injuries start as reddened areas on the skin and can lead to pain and an open wound. What are the causes? This condition is caused by frequent or constant pressure to an area of the body. Decreased blood flow to the skin can eventually cause the skin tissue to die and break down, causing a wound. What increases the risk? You are more likely to develop this condition if you: Are in the hospital or an extended care facility. Are bedridden or in a wheelchair. Have an injury or disease that keeps you from: Moving normally. Feeling pain or pressure. Have a condition that: Makes you sleepy or less alert. Causes poor blood flow. Need to wear a medical device. Have poor control of your bladder or bowel functions (incontinence). Have poor nutrition (malnutrition). If you are at risk for pressure injuries, your health care provider may recommend certain types of mattresses, mattress covers, pillows, cushions, or boots to help prevent them. These may include products filled with air, foam, gel, or sand. What are the signs or symptoms? Symptoms of this condition depend on the severity of the injury. Symptoms may include: Red or dark areas of the skin. Pain, warmth, or a change of skin texture. Blisters. An  open wound. How is this diagnosed? This condition is diagnosed with a medical history and physical exam. You may also have tests, such as: Blood tests. Imaging tests. Blood flow tests. Your pressure injury will be staged based on its severity. Staging is based on: The depth of the tissue injury, including whether there is exposure of muscle, bone, or tendon. The cause of the pressure injury. How is this treated? This condition may be treated by: Relieving or redistributing pressure on your skin. This includes: Frequently changing your position. Avoiding positions that caused the wound or that can make the wound worse. Using specific bed mattresses, chair cushions, or protective boots. Moving medical devices from an area of pressure, or placing padding between the skin and the device. Using foams, creams, or powders to prevent rubbing (friction) on the skin. Keeping your skin clean and dry. This may include using a skin cleanser or skin barrier as told by your health care provider. Cleaning your injury and removing any dead tissue from the wound (debridement). Placing a bandage (dressing) over your injury. Using medicines for pain or to prevent or treat infection. Surgery may be needed if other treatments are not working or if your injury is very deep. Follow these instructions at home: Wound care Follow instructions from your health care provider about how to take care of your wound. Make sure you: Wash your hands with soap and water before and after you change your bandage (dressing). If soap and water are not available, use hand sanitizer. Change your dressing as told  by your health care provider.  Check your wound every day for signs of infection. Have a caregiver do this for you if you are not able. Check for: Redness, swelling, or increased pain. More fluid or blood. Warmth. Pus or a bad smell. Skin care Keep your skin clean and dry. Gently pat your skin dry. Do not rub or  massage your skin. You or a caregiver should check your skin every day for any changes in color or any new blisters or sores (ulcers). Medicines Take over-the-counter and prescription medicines only as told by your health care provider. If you were prescribed an antibiotic medicine, take or apply it as told by your health care provider. Do not stop using the antibiotic even if your condition improves. Reducing and redistributing pressure Do not lie or sit in one position for a long time. Move or change position every 1-2 hours, or as told by your health care provider. Use pillows or cushions to reduce pressure. Ask your health care provider to recommend cushions or pads for you. General instructions Eat a healthy diet that includes lots of protein. Drink enough fluid to keep your urine pale yellow. Be as active as you can every day. Ask your health care provider to suggest safe exercises or activities. Do not abuse drugs or alcohol. Do not use any products that contain nicotine or tobacco, such as cigarettes, e-cigarettes, and chewing tobacco. If you need help quitting, ask your health care provider. Keep all follow-up visits as told by your health care provider. This is important.   Contact a health care provider if: You have: A fever or chills. Pain that is not helped by medicine. Any changes in skin color. New blisters or sores. Pus or a bad smell coming from your wound. Redness, swelling, or pain around your wound. More fluid or blood coming from your wound. Your wound does not improve after 1-2 weeks of treatment. Summary A pressure injury is damage to the skin and underlying tissue that results from pressure being applied to an area of the body. Do not lie or sit in one position for a long time. Your health care provider may advise you to move or change position every 1-2 hours. Follow instructions from your health care provider about how to take care of your wound. Keep all  follow-up visits as told by your health care provider. This is important. This information is not intended to replace advice given to you by your health care provider. Make sure you discuss any questions you have with your health care provider. Document Revised: 05/05/2018 Document Reviewed: 05/05/2018 Elsevier Patient Education  2021 [employer].

## 2021-04-03 NOTE — Progress Notes (Signed)
  Subjective:  Patient ID: Linda Mathis, female    DOB: 29-Apr-1951,  MRN: [id number]  70 y.o. female presents preventative diabetic foot care and painful thick toenails that are difficult to trim. Pain interferes with ambulation. Aggravating factors include wearing enclosed shoe gear. Pain is relieved with periodic professional debridement.  Patient states she had a fall several months ago resulting in infected wound of LLE which required hospitalization. She has since been followed by Dr. Lajoyce Corners and wound is nearly healed.  No Known Allergies  Review of Systems: Negative except as noted in the HPI.   Objective:   Constitutional Pt is a pleasant 70 y.o. Caucasian female morbidly obese in NAD. AAO x 3.   Vascular Capillary fill time to digits <3 seconds b/l lower extremities. Palpable DP pulse(s) b/l lower extremities Palpable PT pulse(s) b/l lower extremities Pedal hair sparse. Lower extremity skin temperature gradient within normal limits. No pain with calf compression b/l.  Neurologic Protective sensation intact 5/5 intact bilaterally with 10g monofilament b/l. Vibratory sensation intact b/l.  Dermatologic Pedal skin with normal turgor, texture and tone bilaterally. No open wounds bilaterally. No interdigital macerations bilaterally. Toenails 1-5 b/l elongated, discolored, dystrophic, thickened, crumbly with subungual debris and tenderness to dorsal palpation. Small area of pressure noted on posterolateral aspect of left heel. Unstageable. Measures 0.2 x 0.4 cm. No erythema, no edema, no drainage, no flucutuance.  Orthopedic: Normal muscle strength 5/5 to all lower extremity muscle groups bilaterally. No pain crepitus or joint limitation noted with ROM b/l. Hallux valgus with bunion deformity noted b/l lower extremities.   Radiographs: None Assessment:   1. Pain due to onychomycosis of toenails of both feet   2. Pressure injury of left heel, unstageable (HCC)   3. Controlled type 2 diabetes  mellitus without complication, without long-term current use of insulin (HCC)     Plan:  Patient was evaluated and treated and all questions answered.  Onychomycosis with pain -Nails palliatively debridement as below. -Educated on self-care  Procedure: Nail Debridement Rationale: Pain Type of Debridement: manual, sharp debridement. Instrumentation: Nail nipper, rotary burr. Number of Nails: 10  -Examined patient. -Discussed heel decubitus ulceration and pressure precautions. Instructed patient to purchase heel protectors and to wear them when in bed/recliner. Avoid walking with heel protectors on as they pose a fall risk. Patient related understanding.. -Continue diabetic foot care principles. -Patient to continue soft, supportive shoe gear daily. -Toenails 1-5 b/l were debrided in length and girth with sterile nail nippers and dremel without iatrogenic bleeding.  -Patient to report any pedal injuries to medical professional immediately. -Patient/POA to call should there be question/concern in the interim.  Return in about 3 months (around 06/28/2021).  Freddie Breech, DPM

## 2021-04-23 DIAGNOSIS — S060XAA Concussion with loss of consciousness status unknown, initial encounter: Secondary | ICD-10-CM | POA: Insufficient documentation

## 2021-04-24 DIAGNOSIS — R9089 Other abnormal findings on diagnostic imaging of central nervous system: Secondary | ICD-10-CM | POA: Insufficient documentation

## 2021-04-30 ENCOUNTER — Ambulatory Visit: Payer: Medicare HMO | Admitting: Neurology

## 2021-04-30 ENCOUNTER — Encounter: Payer: Self-pay | Admitting: Neurology

## 2021-04-30 VITALS — BP 140/81 | HR 93 | Ht 59.5 in | Wt 223.0 lb

## 2021-04-30 DIAGNOSIS — R269 Unspecified abnormalities of gait and mobility: Secondary | ICD-10-CM | POA: Diagnosis not present

## 2021-04-30 DIAGNOSIS — M545 Low back pain, unspecified: Secondary | ICD-10-CM

## 2021-04-30 DIAGNOSIS — G8929 Other chronic pain: Secondary | ICD-10-CM | POA: Diagnosis not present

## 2021-04-30 NOTE — Progress Notes (Signed)
Chief Complaint  Patient presents with   New Patient (Initial Visit)    I have had 12 falls since June Room 16, daughter Linda Mathis in room      ASSESSMENT AND PLAN  Linda SitesCynthia Besecker is a 70 y.o. female   Gait abnormality, multiple falls, History of stroke in 1992, with residual mild left hemiparesis  Vascular risk factor of aging, hypertension, obesity, smoking, hyperlipidemia  On aspirin 81 mg daily  MRI of the brain described moderate to severe small vessel disease,  Bring MRI films at next visit  Chronic low back pain  Her gait abnormality multifactorial, this likely combination of her age, obesity, deconditioning, residual left hemiparesis, mild distal weakness, chronic low back pain, also a component of orthostatic hypotension, anemia, polypharmacy treatment  Continue physical therapy  MRI of lumbar spine to rule out lumbar radiculopathy  EMG nerve conduction study    Anemia  Hemoglobin 10.8, repeat laboratory evaluations  DIAGNOSTIC DATA (LABS, IMAGING, TESTING) - I reviewed patient records, labs, notes, testing and imaging myself where available. Laboratory evaluation in 2022: CMP showed calcium 8.7, creatinine 0.95, CBC showed hemoglobin of 10.8  MRI of the brain without contrast April 25, 2021 from [facility]  1. No acute intracranial abnormality.    2. Findings compatible with moderate diffuse sequela of chronic microvascular disease. Remote infarct in the right periventricular white matter and basalganglia with remote lacunar infarcts involving the cerebellum.  MRI of pelvis without contrast July 2022 1. No evidence of fracture. No malalignment.  2. Findings suggestive of mild bursitis overlying the right greater trochanter.  CT cervical spine without contrast July 2022  Alignment: No acute traumatic malalignment.   .  Craniocervical junction: No evidence of acute fracture or dislocation.   .  Vertebrae: No acute fractures. Vertebral body heights  maintained.   .  Degenerative changes: Mild degenerative disc disease at C6-C7. Mild multilevel facet arthropathy.    HISTORICAL  Linda Mathis is a 70 year old female seen in request by her primary care physician Dr. Nedra Mathis, Linda Mathis, for  elevation gait abnormality, multiple falls, initial evaluation is with her daughter Linda Mathis on April 30, 2021   I reviewed and summarized the referring note.  Past medical history Hypertension Hyperlipidemia Depression anxiety, chronic insomnia,  Restless leg syndrome requip 3mg  qhs Stroke in 1992, Left hemiparesis, required 8 months therapy, with only mild residual left side weakness, Coronary artery disease, in 1991,  Polypharmacy, trazodone 300mg  qhs, gabapentin 300mg  2 tab bid, Prozac 40mg  qhs,  Smoker, few cigarettes a day   [place], but began to experience multiple falls since 2021, gradually declining functional status, [place] first live in apartment by herself, but suffered a significant fall with significant left lower extremity skin damage in February 2022,  In February 2022 she is squatting down to get a blanket out of her closet, when she got up, she felt lightheaded, fainting sensation, she tried to manage to her back, then she fell, the bed rail scraped her left leg, with extensive lesions, that was followed by infection, prolonged wound care, only recently, she had had a significant improvement  She continues to fall multiple times each week, mother significant for on April 23, 2021, she had a busy morning, received physical therapy, went out with her daughter, had lunch, coming back home around 2:30 PM, stepping up stairs, she only stepped on the steps with half of her right foot, without grip on the  rail, her body suddenly fell backwards, down to 4 steps, transient loss of consciousness, ambulance was called, she was admitted to [facility], had extensive evaluation, and only able to  review report  Laboratory evaluation showed anemia hemoglobin of 10.8  MRI of the brain showed extensive supratentorium small vessel disease, no acute abnormality  CT cervical spine showed multilevel degenerative changes, no fracture, no significant canal or foraminal stenosis described  Patient has long history of chronic low back pain, at least 5 out of 10, radiating pain to bilateral lower extremity, gradually worsening urinary urgency, previously in [place], she received epidural injection every so often, which has been helpful, now she is under the pain management of Dr. Eather Mathis, receiving tramadol 50 mg 2 tablets every night,  Also had long history of restless leg syndrome, described urge to move her leg, difficulty holding still, difficulty going to sleep, now she is taking Requip 3 mg every night, gabapentin 300 mg 2 tablets twice a day, which seems to help her symptoms some,  Polypharmacy treatment due to long history of mood disorder, chronic insomnia, obstructive sleep apnea, using CPAP machine, taking trazodone 300 mg every night, also Prozac 40 mg every morning  PHYSICAL EXAM:   Vitals:   04/30/21 0808  BP: 140/81  Pulse: 93  Weight: 223 lb (101.2 kg)  Height: 4' 11.5" (1.511 m)   Not recorded    Positive orthostatic blood pressure changes, sitting down 130/70, standing up 110/50 Body mass index is 44.29 kg/m.  PHYSICAL EXAMNIATION: Obese, tired looking elderly female  Gen: NAD, conversant, well nourised, well groomed                     Cardiovascular: Regular rate rhythm, no peripheral edema, warm, nontender. Eyes: Conjunctivae clear without exudates or hemorrhage Neck: Supple, no carotid bruits. Pulmonary: Clear to auscultation bilaterally   NEUROLOGICAL EXAM:  MENTAL STATUS: Speech:    Speech is normal; fluent and spontaneous with normal comprehension.  Cognition:     Orientation to time, place and person     Normal recent and remote memory     Normal  Attention span and concentration     Normal Language, naming, repeating,spontaneous speech     Fund of knowledge   CRANIAL NERVES: CN II: Visual fields are full to confrontation. Pupils are round equal and briskly reactive to light. CN III, IV, VI: extraocular movement are normal. No ptosis. CN V: Facial sensation is intact to light touch CN VII: Face is symmetric with normal eye closure  CN VIII: Hearing is normal to causal conversation. CN IX, X: Phonation is normal. CN XI: Head turning and shoulder shrug are intact  MOTOR: Fixation of left upper extremity on rapid rotating movement, slight drift of left lower extremity, mild bilateral toe flexion, extension weakness  REFLEXES: Reflexes are brisk at the left biceps, triceps, more brisk at right knee knees, and absent at ankles. Plantar responses are flexor.  SENSORY: Intact to light touch, pinprick and vibratory sensation are intact in fingers and toes.  COORDINATION: There is no trunk or limb dysmetria noted.  GAIT/STANCE: She needs push-up to get up from seated position, unsteady, dragging left leg, complains of dizziness when get up from seated position, REVIEW OF SYSTEMS:  Full 14 system review of systems performed and notable only for as above All other review of systems were negative.   ALLERGIES: No Known Allergies  HOME MEDICATIONS: Current Outpatient Medications  Medication Sig Dispense Refill  amLODipine-benazepril (LOTREL) 5-20 MG capsule Take 1 capsule by mouth daily.     aspirin EC 81 MG tablet Take 81 mg by mouth daily. Swallow whole.     FLUoxetine (PROZAC) 40 MG capsule Take 40 mg by mouth daily.     gabapentin (NEURONTIN) 300 MG capsule Take 300 mg by mouth 3 (three) times daily.     omeprazole (PRILOSEC) 40 MG capsule Take 40 mg by mouth daily.     polyethylene glycol powder (GLYCOLAX/MIRALAX) 17 GM/SCOOP powder DISSOLVE 1 CAPFUL IN WATER AND TAKE BY MOUTH DAILY AS NEEDED FOR UP TO 5 DAYS FOR MILD  CONSTIPATION TAKE WHILE ON PAIN MEDICATION 238 g 0   rOPINIRole (REQUIP) 3 MG tablet Take 3 mg by mouth at bedtime.     rosuvastatin (CRESTOR) 20 MG tablet Take 20 mg by mouth at bedtime.     traMADol (ULTRAM) 50 MG tablet Take 50 mg by mouth 4 (four) times daily as needed.     trazodone (DESYREL) 300 MG tablet Take 300 mg by mouth at bedtime.     cyclobenzaprine (FLEXERIL) 10 MG tablet Take 1 tablet by mouth 2 (two) times daily as needed.     doxycycline (VIBRA-TABS) 100 MG tablet Take 1 tablet (100 mg total) by mouth 2 (two) times daily. 60 tablet 0   doxycycline (VIBRA-TABS) 100 MG tablet TAKE 1 TABLET (100 MG TOTAL) BY MOUTH TWO TIMES DAILY FOR 4 DAYS. 8 tablet 0   HYDROcodone-acetaminophen (NORCO) 5-325 MG tablet Take 1 tablet by mouth every 6 (six) hours as needed. 30 tablet 0   ondansetron (ZOFRAN-ODT) 4 MG disintegrating tablet Take 4 mg by mouth every 6 (six) hours as needed.     oxyCODONE (OXY IR/ROXICODONE) 5 MG immediate release tablet TAKE 1 TABLET (5 MG TOTAL) BY MOUTH EVERY EIGHT HOURS AS NEEDED FOR UP TO 5 DAYS FOR SEVERE PAIN. 15 tablet 0   oxyCODONE (OXY IR/ROXICODONE) 5 MG immediate release tablet TAKE 1 TABLET (5 MG TOTAL) BY MOUTH EVERY EIGHT HOURS AS NEEDED FOR UP TO 5 DAYS FOR SEVERE PAIN. 15 tablet 0   oxyCODONE (OXY IR/ROXICODONE) 5 MG immediate release tablet TAKE 1 TABLET (5 MG TOTAL) BY MOUTH EVERY EIGHT HOURS AS NEEDED FOR UP TO 5 DAYS FOR SEVERE PAIN. 9 tablet 0   No current facility-administered medications for this visit.    PAST MEDICAL HISTORY: Past Medical History:  Diagnosis Date   CVA (cerebral vascular accident) (HCC)    Depression    Family history of adverse reaction to anesthesia    GERD (gastroesophageal reflux disease)    HTN (hypertension)    Hyperlipidemia    Left-sided weakness    RLS (restless legs syndrome)    Wound of left leg 11/21/2020   MULTIPLE STICHES    PAST SURGICAL HISTORY: Past Surgical History:  Procedure Laterality Date    ABDOMINAL HYSTERECTOMY     ACHILLES TENDON REPAIR     complication of postop wound requiring home health wound care   CARPAL TUNNEL RELEASE Right    CHOLECYSTECTOMY     L4-L5 with Coflex device     partial nail avulsion medial border hallux Left    TONSILLECTOMY      FAMILY HISTORY: History reviewed. No pertinent family history.  SOCIAL HISTORY: Social History   Socioeconomic History   Marital status: Widowed    Spouse name: Not on file   Number of children: Not on file   Years of education: Not on file  Highest education level: Not on file  Occupational History   Not on file  Tobacco Use   Smoking status: Every Day    Packs/day: 0.50    Pack years: 0.00    Types: Cigarettes   Smokeless tobacco: Never  Vaping Use   Vaping Use: Never used  Substance and Sexual Activity   Alcohol use: Not Currently   Drug use: Never   Sexual activity: Not on file  Other Topics Concern   Not on file  Social History Narrative   Lives with daughter   Right Handed   Drinks 2-3 cups caffeine daily   Social Determinants of Health   Financial Resource Strain: Not on file  Food Insecurity: Not on file  Transportation Needs: Not on file  Physical Activity: Not on file  Stress: Not on file  Social Connections: Not on file  Intimate Partner Violence: Not on file    Total time spent reviewing the chart, obtaining history, examined patient, ordering tests, documentation, consultations and family, care coordination was 65 minutes    Levert Feinstein, M.D. Ph.D.  [facility] [address] [place], [place] [address] Ph: [phone] Fax: [phone]  CC:  [name], MD [address] [place],  [place] [address]  [name], MD

## 2021-05-04 LAB — COMPREHENSIVE METABOLIC PANEL
ALT: 11 IU/L (ref 0–32)
AST: 20 IU/L (ref 0–40)
Albumin/Globulin Ratio: 1.6 (ref 1.2–2.2)
Albumin: 4.2 g/dL (ref 3.7–4.7)
Alkaline Phosphatase: 102 IU/L (ref 44–121)
BUN/Creatinine Ratio: 14 (ref 12–28)
BUN: 11 mg/dL (ref 8–27)
Bilirubin Total: <0.2 mg/dL (ref 0.0–1.2)
CO2: 23 mmol/L (ref 20–29)
Calcium: 9.9 mg/dL (ref 8.7–10.3)
Chloride: 97 mmol/L (ref 96–106)
Creatinine, Ser: 0.76 mg/dL (ref 0.57–1.00)
Globulin, Total: 2.7 g/dL (ref 1.5–4.5)
Glucose: 99 mg/dL (ref 65–99)
Potassium: 4.2 mmol/L (ref 3.5–5.2)
Sodium: 137 mmol/L (ref 134–144)
Total Protein: 6.9 g/dL (ref 6.0–8.5)
eGFR: 83 mL/min/{1.73_m2} (ref >59)

## 2021-05-04 LAB — LIPID PANEL
Chol/HDL Ratio: 2.7 ratio (ref 0.0–4.4)
Cholesterol, Total: 167 mg/dL (ref 100–199)
HDL: 61 mg/dL (ref >39)
LDL Chol Calc (NIH): 80 mg/dL (ref 0–99)
Triglycerides: 153 mg/dL — ABNORMAL HIGH (ref 0–149)
VLDL Cholesterol Cal: 26 mg/dL (ref 5–40)

## 2021-05-04 LAB — CBC WITH DIFFERENTIAL/PLATELET
Basophils Absolute: 0 10*3/uL (ref 0.0–0.2)
Basos: 0 %
EOS (ABSOLUTE): 0.2 10*3/uL (ref 0.0–0.4)
Eos: 2 %
Hematocrit: 40.9 % (ref 34.0–46.6)
Hemoglobin: 12.8 g/dL (ref 11.1–15.9)
Immature Grans (Abs): 0 10*3/uL (ref 0.0–0.1)
Immature Granulocytes: 0 %
Lymphocytes Absolute: 1.4 10*3/uL (ref 0.7–3.1)
Lymphs: 19 %
MCH: 24.1 pg — ABNORMAL LOW (ref 26.6–33.0)
MCHC: 31.3 g/dL — ABNORMAL LOW (ref 31.5–35.7)
MCV: 77 fL — ABNORMAL LOW (ref 79–97)
Monocytes Absolute: 0.6 10*3/uL (ref 0.1–0.9)
Monocytes: 8 %
Neutrophils Absolute: 5 10*3/uL (ref 1.4–7.0)
Neutrophils: 71 %
Platelets: 246 10*3/uL (ref 150–450)
RBC: 5.32 x10E6/uL — ABNORMAL HIGH (ref 3.77–5.28)
RDW: 21.6 % — ABNORMAL HIGH (ref 11.7–15.4)
WBC: 7.2 10*3/uL (ref 3.4–10.8)

## 2021-05-04 LAB — SEDIMENTATION RATE: Sed Rate: 66 mm/hr — ABNORMAL HIGH (ref 0–40)

## 2021-05-04 LAB — FERRITIN: Ferritin: 23 ng/mL (ref 15–150)

## 2021-05-04 LAB — RPR

## 2021-05-04 LAB — ANA W/REFLEX IF POSITIVE: Anti Nuclear Antibody (ANA): NEGATIVE

## 2021-05-04 LAB — FOLATE: Folate: 7.5 ng/mL (ref >3.0)

## 2021-05-04 LAB — HGB A1C W/O EAG: Hgb A1c MFr Bld: 6.2 % — ABNORMAL HIGH (ref 4.8–5.6)

## 2021-05-04 LAB — C-REACTIVE PROTEIN: CRP: 4 mg/L (ref 0–10)

## 2021-05-04 LAB — CK: Total CK: 222 U/L — ABNORMAL HIGH (ref 32–182)

## 2021-05-04 LAB — VITAMIN B12: Vitamin B-12: 232 pg/mL (ref 232–1245)

## 2021-05-04 LAB — VITAMIN D 25 HYDROXY (VIT D DEFICIENCY, FRACTURES): Vit D, 25-Hydroxy: 29.4 ng/mL — ABNORMAL LOW (ref 30.0–100.0)

## 2021-05-04 LAB — TSH: TSH: 2.32 u[IU]/mL (ref 0.450–4.500)

## 2021-05-06 ENCOUNTER — Telehealth: Payer: Self-pay | Admitting: Neurology

## 2021-05-06 NOTE — Telephone Encounter (Signed)
I spoke to the patient. She verbalized understanding of the findings and will start the recommended supplements.

## 2021-05-06 NOTE — Telephone Encounter (Signed)
Please call patient,   Mild elevated A1C 6.2,  triglycerides, she should control it by exercise and diet  Slight low Vit D 29.4,  over the counter Vit D3 supplement 1000units daily Low normal B12 232, over the counter B12 1000 mcg daily  Elevated ESR 66, with normal CRP 4, of unknown clinical significance, may repeat lab later.  Mildly elevated CPK 222  of unknown clinical significance, may repeat lab later.  I have forward result to her PCP Dr. Cher Nakai, MD

## 2021-05-08 ENCOUNTER — Ambulatory Visit (INDEPENDENT_AMBULATORY_CARE_PROVIDER_SITE_OTHER): Payer: Medicare HMO | Admitting: Physician Assistant

## 2021-05-08 ENCOUNTER — Encounter: Payer: Self-pay | Admitting: Physician Assistant

## 2021-05-08 DIAGNOSIS — S81802D Unspecified open wound, left lower leg, subsequent encounter: Secondary | ICD-10-CM

## 2021-05-08 NOTE — Progress Notes (Signed)
Office Visit Note   Patient: Linda Mathis           Date of Birth: 05-28-1951           MRN: [id number] Visit Date: 05/08/2021              Requested by: [name], MD [address] [place],  [place] [address] PCP: [name], MD  No chief complaint on file.     HPI: Presents in follow-up for her left lower extremity wound.  She was recently hospitalized after falling onto her head while losing her balance.  She did not have any orthopedic injuries but did have a severe concussion.  She is working with a neurologist to see how, she is having so many falls although the last one was a mechanical fall.  Assessment & Plan: Visit Diagnoses: No diagnosis found.  Plan: Patient will follow-up as needed.  Follow-Up Instructions: No follow-ups on file.   Ortho Exam  Patient is alert, oriented, no adenopathy, well-dressed, normal affect, normal respiratory effort. Examination of left lower extremity no swelling no cellulitis no open ulcers compartments are soft and nontender.  Wound is completely healed with healthy skin.  No tenderness no signs of infection  Imaging: No results found. No images are attached to the encounter.  Labs: Lab Results  Component Value Date   HGBA1C 6.2 (H) 04/30/2021   ESRSEDRATE 66 (H) 04/30/2021   CRP 4 04/30/2021   REPTSTATUS 11/29/2020 FINAL 11/24/2020   CULT  11/24/2020    NO GROWTH 5 DAYS Performed at [facility], [address], [place], [place] [address]      Lab Results  Component Value Date   ALBUMIN 4.2 04/30/2021    No results found for: MG Lab Results  Component Value Date   VD25OH 29.4 (L) 04/30/2021    No results found for: PREALBUMIN CBC EXTENDED Latest Ref Rng & Units 04/30/2021 11/28/2020 11/27/2020  WBC 3.4 - 10.8 x10E3/uL 7.2 5.3 5.1  RBC 3.77 - 5.28 x10E6/uL 5.32(H) 3.76(L) 3.98  HGB 11.1 - 15.9 g/dL 78.4 10.5(L) 10.7(L)  HCT 34.0 - 46.6 % 40.9 32.1(L) 34.4(L)  PLT 150 - 450 x10E3/uL 246 210 205   NEUTROABS 1.4 - 7.0 x10E3/uL 5.0 - -  LYMPHSABS 0.7 - 3.1 x10E3/uL 1.4 - -     There is no height or weight on file to calculate BMI.  Orders:  No orders of the defined types were placed in this encounter.  No orders of the defined types were placed in this encounter.    Procedures: No procedures performed  Clinical Data: No additional findings.  ROS:  All other systems negative, except as noted in the HPI. Review of Systems  Objective: Vital Signs: There were no vitals taken for this visit.  Specialty Comments:  No specialty comments available.  PMFS History: Patient Active Problem List   Diagnosis Date Noted   Gait abnormality 04/30/2021   Chronic bilateral low back pain without sciatica 04/30/2021   Laceration of left leg 11/25/2020   Wound of left lower extremity 11/24/2020   Past Medical History:  Diagnosis Date   CVA (cerebral vascular accident) (HCC)    Depression    Family history of adverse reaction to anesthesia    GERD (gastroesophageal reflux disease)    HTN (hypertension)    Hyperlipidemia    Left-sided weakness    RLS (restless legs syndrome)    Wound of left leg 11/21/2020  MULTIPLE STICHES    No family history on file.  Past Surgical History:  Procedure Laterality Date   ABDOMINAL HYSTERECTOMY     ACHILLES TENDON REPAIR     complication of postop wound requiring home health wound care   CARPAL TUNNEL RELEASE Right    CHOLECYSTECTOMY     L4-L5 with Coflex device     partial nail avulsion medial border hallux Left    TONSILLECTOMY     Social History   Occupational History   Not on file  Tobacco Use   Smoking status: Every Day    Packs/day: 0.50    Types: Cigarettes   Smokeless tobacco: Never  Vaping Use   Vaping Use: Never used  Substance and Sexual Activity   Alcohol use: Not Currently   Drug use: Never   Sexual activity: Not on file

## 2021-05-10 ENCOUNTER — Other Ambulatory Visit: Payer: Medicare HMO

## 2021-05-11 ENCOUNTER — Other Ambulatory Visit: Payer: Medicare HMO

## 2021-05-14 ENCOUNTER — Ambulatory Visit
Admission: RE | Admit: 2021-05-14 | Discharge: 2021-05-14 | Disposition: A | Discharge status: Home / Self Care | Payer: Medicare HMO | Source: Ambulatory Visit | Attending: Neurology | Admitting: Neurology

## 2021-05-14 DIAGNOSIS — G8929 Other chronic pain: Secondary | ICD-10-CM

## 2021-05-14 DIAGNOSIS — R269 Unspecified abnormalities of gait and mobility: Secondary | ICD-10-CM

## 2021-05-14 DIAGNOSIS — M545 Low back pain, unspecified: Secondary | ICD-10-CM

## 2021-05-20 ENCOUNTER — Telehealth: Payer: Self-pay | Admitting: Neurology

## 2021-05-20 NOTE — Telephone Encounter (Signed)
I called the patient.  MRI of the lumbar spine shows some postoperative changes and mild to moderate neuroforaminal narrowing at the L3-4 and L4-5 levels primarily on the left.  This study does not explain gait instability reported by the patient.  I discussed the MRI results with her.   MRI lumbar 05/16/21:  IMPRESSION: Abnormal MRI scan of the lumbar spine without contrast showing postoperative changes of L4-5 laminectomy with metallic artifacts with prominent facet degenerative changes throughout most prominent at L4-5 where there is moderate left-sided foraminal narrowing and at L3-4 where there is moderate left-sided and mild right-sided foraminal narrowing.

## 2021-05-21 NOTE — Telephone Encounter (Signed)
Pending NCV/EMG 06/19/21.

## 2021-06-19 ENCOUNTER — Ambulatory Visit (INDEPENDENT_AMBULATORY_CARE_PROVIDER_SITE_OTHER): Payer: Medicare HMO | Admitting: Neurology

## 2021-06-19 DIAGNOSIS — R2689 Other abnormalities of gait and mobility: Secondary | ICD-10-CM | POA: Diagnosis not present

## 2021-06-19 DIAGNOSIS — R269 Unspecified abnormalities of gait and mobility: Secondary | ICD-10-CM | POA: Diagnosis not present

## 2021-06-19 DIAGNOSIS — M545 Low back pain, unspecified: Secondary | ICD-10-CM

## 2021-06-19 DIAGNOSIS — G8929 Other chronic pain: Secondary | ICD-10-CM

## 2021-06-19 NOTE — Progress Notes (Signed)
No chief complaint on file.     ASSESSMENT AND PLAN  Linda Mathis is a 70 y.o. female   Gait abnormality, multiple falls, History of stroke in 1992, with residual mild left hemiparesis  Vascular risk factor of aging, hypertension, obesity, smoking, hyperlipidemia  On aspirin 81 mg daily  MRI of the brain in July 2022 from [facility] described moderate to severe small vessel disease,   Chronic Mathis back pain  Her gait abnormality multifactorial, this likely combination of her age, obesity, deconditioning, residual left hemiparesis,  also a component of orthostatic hypotension, anemia, polypharmacy treatment  Continue physical therapy  MRI of lumbar spine showed postoperative changes, multilevel degenerative changes, there was no significant canal or foraminal narrowing  EMG nerve conduction study showed no large fiber peripheral neuropathy   Anemia  Hemoglobin 10.8, repeat laboratory evaluations  DIAGNOSTIC DATA (LABS, IMAGING, TESTING) - I reviewed patient records, labs, notes, testing and imaging myself where available. Laboratory evaluation in 2022: CMP showed calcium 8.7, creatinine 0.95, CBC showed hemoglobin of 10.8  MRI of the brain without contrast April 25, 2021 from [facility]  1. No acute intracranial abnormality.    2. Findings compatible with moderate diffuse sequela of chronic microvascular disease. Remote infarct in the right periventricular white matter and basalganglia with remote lacunar infarcts involving the cerebellum.  MRI of pelvis without contrast July 2022 1. No evidence of fracture. No malalignment.  2. Findings suggestive of mild bursitis overlying the right greater trochanter.  CT cervical spine without contrast July 2022  Alignment: No acute traumatic malalignment.   .  Craniocervical junction: No evidence of acute fracture or dislocation.   .  Vertebrae: No acute fractures. Vertebral body heights maintained.   .  Degenerative changes:  Mild degenerative disc disease at C6-C7. Mild multilevel facet arthropathy.    HISTORICAL  Linda Mathis is a 70 year old female seen in request by her primary care physician Dr. Truman Hayward, Joylene Igo, for  elevation gait abnormality, multiple falls, initial evaluation is with her daughter Linda Mathis on April 30, 2021   I reviewed and summarized the referring note.  Past medical history Hypertension Hyperlipidemia Depression anxiety, chronic insomnia,  Restless leg syndrome requip 64m qhs Stroke in 1992, Left hemiparesis, required 8 months therapy, with only mild residual left side weakness, Coronary artery disease, in 1991,  Polypharmacy, trazodone 3037mqhs, gabapentin 30034m tab bid, Prozac 40m61ms,  Smoker, few cigarettes a day   [place] began to experience multiple falls since 2021, gradually declining functional status, [place] [facility] [place] first live in apartment by herself, but suffered a significant fall with significant left lower extremity skin damage in February 2022,  In February 2022 she is squatting down to get a blanket out of her closet, when she got up, she felt lightheaded, fainting sensation, she tried to manage to her back, then she fell, the bed rail scraped her left leg, with extensive lesions, that was followed by infection, prolonged wound care, only recently, she had had a significant improvement  She continues to fall multiple times each week, mother significant for on April 23, 2021, she had a busy morning, received physical therapy, went out with her daughter, had lunch, coming back home around 2:30 PM, stepping up stairs, she only stepped on the steps with half of her right foot, without grip on the rail, her body suddenly fell backwards, down to 4 steps, transient loss of consciousness, ambulance was  called, she was admitted to [facility], had extensive evaluation, and only able to review report  Laboratory evaluation  showed anemia hemoglobin of 10.8  MRI of the brain showed extensive supratentorium small vessel disease, no acute abnormality  CT cervical spine showed multilevel degenerative changes, no fracture, no significant canal or foraminal stenosis described  Patient has long history of chronic Mathis back pain, at least 5 out of 10, radiating pain to bilateral lower extremity, gradually worsening urinary urgency, previously in [place], she received epidural injection every so often, which has been helpful, now she is under the pain management of Dr. Lennox Grumbles, receiving tramadol 50 mg 2 tablets every night,  Also had long history of restless leg syndrome, described urge to move her leg, difficulty holding still, difficulty going to sleep, now she is taking Requip 3 mg every night, gabapentin 300 mg 2 tablets twice a day, which seems to help her symptoms some,  Polypharmacy treatment due to long history of mood disorder, chronic insomnia, obstructive sleep apnea, using CPAP machine, taking trazodone 300 mg every night, also Prozac 40 mg every morning  UPDATE June 19 2021: Personally reviewed MRI of the lumbar on May 14, 2021: Abnormal multilevel degenerative changes, postoperative changes of D5-3, with metallic artifact, prominent facet degenerative changes, moderate left side foraminal narrowing at L3-4, L4-5, mild right-sided foraminal narrowing  Laboratory evaluation showed  LDL 80, A1c of 6.2, CBC, hemoglobin of 12.8, vitamin D of 29, CMP, creatinine of 0.76, negative ANA, ferritin 23, ESR of 66, C-reactive protein of 4, negative RPR, B12 232, CPK of 222   She return for electrodiagnostic study today, which showed no evidence of large fiber peripheral neuropathy or lumbosacral radiculopathy   PHYSICAL EXAM:   There were no vitals filed for this visit.  Not recorded    Positive orthostatic blood pressure changes, sitting down 130/70, standing up 110/50 There is no height or weight on file to  calculate BMI.  PHYSICAL EXAMNIATION: Obese, tired looking elderly female  Gen: NAD, conversant, well nourised, well groomed                     Cardiovascular: Regular rate rhythm, no peripheral edema, warm, nontender. Eyes: Conjunctivae clear without exudates or hemorrhage Neck: Supple, no carotid bruits. Pulmonary: Clear to auscultation bilaterally   NEUROLOGICAL EXAM:  MENTAL STATUS: Speech:    Speech is normal; fluent and spontaneous with normal comprehension.  Cognition:     Orientation to time, place and person     Normal recent and remote memory     Normal Attention span and concentration     Normal Language, naming, repeating,spontaneous speech     Fund of knowledge   CRANIAL NERVES: CN II: Visual fields are full to confrontation. Pupils are round equal and briskly reactive to light. CN III, IV, VI: extraocular movement are normal. No ptosis. CN V: Facial sensation is intact to light touch CN VII: Face is symmetric with normal eye closure  CN VIII: Hearing is normal to causal conversation. CN IX, X: Phonation is normal. CN XI: Head turning and shoulder shrug are intact  MOTOR: Fixation of left upper extremity on rapid rotating movement, slight drift of left lower extremity, mild bilateral toe flexion, extension weakness  REFLEXES: Reflexes are brisk at the left biceps, triceps, more brisk at right knee knees, and absent at ankles. Plantar responses are flexor.  SENSORY: Intact to light touch, pinprick and vibratory sensation are intact in fingers and toes.  COORDINATION: There is no trunk or limb dysmetria noted.  GAIT/STANCE: She needs push-up to get up from seated position, unsteady, dragging left leg, complains of dizziness when get up from seated position, REVIEW OF SYSTEMS:  Full 14 system review of systems performed and notable only for as above All other review of systems were negative.   ALLERGIES: No Known Allergies  HOME MEDICATIONS: Current  Outpatient Medications  Medication Sig Dispense Refill   amLODipine-benazepril (LOTREL) 5-20 MG capsule Take 1 capsule by mouth daily.     aspirin EC 81 MG tablet Take 81 mg by mouth daily. Swallow whole.     Cholecalciferol (VITAMIN D3 PO) Take 1,000 Units by mouth daily.     cyclobenzaprine (FLEXERIL) 10 MG tablet Take 1 tablet by mouth 2 (two) times daily as needed.     doxycycline (VIBRA-TABS) 100 MG tablet Take 1 tablet (100 mg total) by mouth 2 (two) times daily. 60 tablet 0   doxycycline (VIBRA-TABS) 100 MG tablet TAKE 1 TABLET (100 MG TOTAL) BY MOUTH TWO TIMES DAILY FOR 4 DAYS. 8 tablet 0   FLUoxetine (PROZAC) 40 MG capsule Take 40 mg by mouth daily.     gabapentin (NEURONTIN) 300 MG capsule Take 300 mg by mouth 3 (three) times daily.     HYDROcodone-acetaminophen (NORCO) 5-325 MG tablet Take 1 tablet by mouth every 6 (six) hours as needed. 30 tablet 0   omeprazole (PRILOSEC) 40 MG capsule Take 40 mg by mouth daily.     ondansetron (ZOFRAN-ODT) 4 MG disintegrating tablet Take 4 mg by mouth every 6 (six) hours as needed.     polyethylene glycol powder (GLYCOLAX/MIRALAX) 17 GM/SCOOP powder DISSOLVE 1 CAPFUL IN WATER AND TAKE BY MOUTH DAILY AS NEEDED FOR UP TO 5 DAYS FOR MILD CONSTIPATION TAKE WHILE ON PAIN MEDICATION 238 g 0   rOPINIRole (REQUIP) 3 MG tablet Take 3 mg by mouth at bedtime.     rosuvastatin (CRESTOR) 20 MG tablet Take 20 mg by mouth at bedtime.     traMADol (ULTRAM) 50 MG tablet Take 50 mg by mouth 4 (four) times daily as needed.     trazodone (DESYREL) 300 MG tablet Take 300 mg by mouth at bedtime.     vitamin B-12 (CYANOCOBALAMIN) 1000 MCG tablet Take 1,000 mcg by mouth daily.     No current facility-administered medications for this visit.    PAST MEDICAL HISTORY: Past Medical History:  Diagnosis Date   CVA (cerebral vascular accident) ([place])    Depression    Family history of adverse reaction to anesthesia    GERD (gastroesophageal reflux disease)    HTN  (hypertension)    Hyperlipidemia    Left-sided weakness    RLS (restless legs syndrome)    Wound of left leg 11/21/2020   MULTIPLE STICHES    PAST SURGICAL HISTORY: Past Surgical History:  Procedure Laterality Date   ABDOMINAL HYSTERECTOMY     ACHILLES TENDON REPAIR     complication of postop wound requiring home health wound care   CARPAL TUNNEL RELEASE Right    CHOLECYSTECTOMY     L4-L5 with Coflex device     partial nail avulsion medial border hallux Left    TONSILLECTOMY      FAMILY HISTORY: No family history on file.  SOCIAL HISTORY: Social History   Socioeconomic History   Marital status: Widowed    Spouse name: Not on file   Number of children: Not on file   Years of education: Not on  file   Highest education level: Not on file  Occupational History   Not on file  Tobacco Use   Smoking status: Every Day    Packs/day: 0.50    Types: Cigarettes   Smokeless tobacco: Never  Vaping Use   Vaping Use: Never used  Substance and Sexual Activity   Alcohol use: Not Currently   Drug use: Never   Sexual activity: Not on file  Other Topics Concern   Not on file  Social History Narrative   Lives with daughter   Right Handed   Drinks 2-3 cups caffeine daily   Social Determinants of Health   Financial Resource Strain: Not on file  Food Insecurity: Not on file  Transportation Needs: Not on file  Physical Activity: Not on file  Stress: Not on file  Social Connections: Not on file  Intimate Partner Violence: Not on file    Total time spent reviewing the chart, obtaining history, examined patient, ordering tests, documentation, consultations and family, care coordination was 53 minutes    Marcial Pacas, M.D. Ph.D.  [facility] [address], [place], [place] [address] Ph: [phone] Fax: [phone]  CC:  Cher Nakai, MD [place],  [place] [address]  Cher Nakai, MD

## 2021-06-19 NOTE — Procedures (Signed)
Full Name: Linda Mathis Gender: Female MRN #: [id number] Date of Birth: 1951/06/21    Visit Date: 06/19/2021 09:31 Age: 70 Years Examining Physician: Levert Feinstein, MD  Referring Physician: Levert Feinstein, MD History: 70 years old female, presented with worsening low back pain, gait abnormality,  Summary of the test Nerve conduction study: Bilateral sural, superficial peroneal sensory responses were normal.  Bilateral tibial, peroneal to EDB motor responses were normal  Electromyography: Selected needle examination of bilateral lower extremity muscles were performed, there was no significant abnormality noted.  There was excessive bleeding at the needle track, lumbar paraspinal muscle was not examined.  Conclusion: This is a normal study, there is no electrodiagnostic evidence of large fiber peripheral neuropathy or bilateral lumbosacral radiculopathy.    ------------------------------- Levert Feinstein M.D. PhD  [facility] [address] [place], [place] [address] Tel: [phone] Fax: [phone]  Verbal informed consent was obtained from the patient, patient was informed of potential risk of procedure, including bruising, bleeding, hematoma formation, infection, muscle weakness, muscle pain, numbness, among others.        MNC    Nerve / Sites Muscle Latency Ref. Amplitude Ref. Rel Amp Segments Distance Velocity Ref. Area    ms ms mV mV %  cm m/s m/s mVms  L Peroneal - EDB     Ankle EDB 4.3 ?6.5 6.2 ?2.0 100 Ankle - EDB 9   18.5     Fib head EDB 9.9  6.1  98 Fib head - Ankle 25 44 ?44 18.7     Pop fossa EDB 12.2  5.0  81.8 Pop fossa - Fib head 10 44 ?44 14.1         Pop fossa - Ankle      R Peroneal - EDB     Ankle EDB 3.3 ?6.5 7.9 ?2.0 100 Ankle - EDB 9   22.2     Fib head EDB 8.9  5.2  65.1 Fib head - Ankle 25 45 ?44 15.3     Pop fossa EDB 11.1  5.2  101 Pop fossa - Fib head 10 45 ?44 16.0         Pop fossa - Ankle      L Tibial - AH      Ankle AH 3.4 ?5.8 8.0 ?4.0 100 Ankle - AH 9   23.3     Pop fossa AH 11.2  7.1  88.4 Pop fossa - Ankle 33 42 ?41 29.9  R Tibial - AH     Ankle AH 3.0 ?5.8 9.3 ?4.0 100 Ankle - AH 9   28.1     Pop fossa AH 10.5  6.4  69 Pop fossa - Ankle 33 44 ?41 31.2             SNC    Nerve / Sites Rec. Site Peak Lat Ref.  Amp Ref. Segments Distance    ms ms V V  cm  L Sural - Ankle (Calf)     Calf Ankle 1.9 ?4.4 6 ?6 Calf - Ankle 7  R Sural - Ankle (Calf)     Calf Ankle 2.9 ?4.4 7 ?6 Calf - Ankle 7  L Superficial peroneal - Ankle     Lat leg Ankle 2.5 ?4.4 10 ?6 Lat leg - Ankle 7  R Superficial peroneal - Ankle     Lat leg Ankle 2.5 ?4.4 7 ?6 Lat leg - Ankle 7  F  Wave    Nerve F Lat Ref.   ms ms  L Tibial - AH 47.9 ?56.0  R Tibial - AH 47.6 ?56.0         H Reflex    Nerve H Lat   ms   Left Right Ref.  Tibial - Soleus 33.7 34.8 ?35.0         EMG Summary Table    Spontaneous MUAP Recruitment  Muscle IA Fib PSW Fasc Other Amp Dur. Poly Pattern  R. Tibialis anterior Normal None None None _______ Normal Normal Normal Reduced  R. Tibialis posterior Normal None None None _______ Normal Normal Normal Reduced  R. Peroneus longus Normal None None None _______ Normal Normal Normal Normal  R. Gastrocnemius (Medial head) Normal None None None _______ Normal Normal Normal Normal  R. Vastus lateralis Normal None None None _______ Normal Normal Normal Normal  L. Tibialis anterior Normal None None None _______ Normal Normal Normal Reduced  L. Tibialis posterior Normal None None None _______ Normal Normal Normal Reduced  L. Peroneus longus Normal None None None _______ Normal Normal Normal Normal  L. Gastrocnemius (Medial head) Normal None None None _______ Normal Normal Normal Normal  L. Vastus lateralis Normal None None None _______ Normal Normal Normal Normal

## 2021-07-18 ENCOUNTER — Encounter: Payer: Self-pay | Admitting: Podiatry

## 2021-07-18 ENCOUNTER — Other Ambulatory Visit: Payer: Self-pay

## 2021-07-18 ENCOUNTER — Ambulatory Visit: Payer: Medicare HMO | Admitting: Podiatry

## 2021-07-18 ENCOUNTER — Other Ambulatory Visit: Payer: Self-pay | Admitting: *Deleted

## 2021-07-18 DIAGNOSIS — B351 Tinea unguium: Secondary | ICD-10-CM | POA: Diagnosis not present

## 2021-07-18 DIAGNOSIS — E119 Type 2 diabetes mellitus without complications: Secondary | ICD-10-CM

## 2021-07-18 DIAGNOSIS — M79674 Pain in right toe(s): Secondary | ICD-10-CM | POA: Diagnosis not present

## 2021-07-18 DIAGNOSIS — W19XXXA Unspecified fall, initial encounter: Secondary | ICD-10-CM | POA: Insufficient documentation

## 2021-07-18 DIAGNOSIS — M25552 Pain in left hip: Secondary | ICD-10-CM | POA: Insufficient documentation

## 2021-07-18 DIAGNOSIS — M79675 Pain in left toe(s): Secondary | ICD-10-CM | POA: Diagnosis not present

## 2021-07-18 DIAGNOSIS — S0003XA Contusion of scalp, initial encounter: Secondary | ICD-10-CM | POA: Insufficient documentation

## 2021-07-19 NOTE — Progress Notes (Signed)
  Subjective:  Patient ID: Linda Mathis, female    DOB: [birth year],  MRN: [id number]  70 y.o. female presents preventative diabetic foot care and painful thick toenails that are difficult to trim. Pain interferes with ambulation. Aggravating factors include wearing enclosed shoe gear. Pain is relieved with periodic professional debridement.  Patient states her left leg wound finally healed. Her blood glucose was 110 mg/dl today. Last A1c was 6.2%.  No Known Allergies  Review of Systems: Negative except as noted in the HPI.   Objective:   Constitutional Pt is a pleasant 70 y.o. Caucasian female morbidly obese in NAD. AAO x 3.   Vascular Capillary fill time to digits <3 seconds b/l lower extremities. Palpable DP pulse(s) b/l lower extremities Palpable PT pulse(s) b/l lower extremities Pedal hair sparse. Lower extremity skin temperature gradient within normal limits. No pain with calf compression b/l.  Neurologic Protective sensation intact 5/5 intact bilaterally with 10g monofilament b/l. Vibratory sensation intact b/l.  Dermatologic Pedal skin with normal turgor, texture and tone bilaterally. No open wounds bilaterally. No interdigital macerations bilaterally. Toenails 1-5 b/l elongated, discolored, dystrophic, thickened, crumbly with subungual debris and tenderness to dorsal palpation.   Orthopedic: Normal muscle strength 5/5 to all lower extremity muscle groups bilaterally. No pain crepitus or joint limitation noted with ROM b/l. Hallux valgus with bunion deformity noted b/l lower extremities.   Radiographs: None Assessment:   1. Pain due to onychomycosis of toenails of both feet   2. Controlled type 2 diabetes mellitus without complication, without long-term current use of insulin (HCC)    Plan:   -No new findings. No new orders. -Continue diabetic foot care principles: inspect feet daily, monitor glucose as recommended by PCP and/or Endocrinologist, and follow prescribed diet per PCP,  Endocrinologist and/or dietician. -Patient to continue soft, supportive shoe gear daily. -Toenails 1-5 b/l were debrided in length and girth with sterile nail nippers and dremel without iatrogenic bleeding.  -Patient to report any pedal injuries to medical professional immediately. -Patient/POA to call should there be question/concern in the interim.  Return in about 3 months (around 10/17/2021).  Freddie Breech, DPM

## 2021-10-03 ENCOUNTER — Encounter: Payer: Self-pay | Admitting: Podiatry

## 2021-10-03 ENCOUNTER — Ambulatory Visit: Payer: Medicare HMO | Admitting: Podiatry

## 2021-10-03 DIAGNOSIS — B351 Tinea unguium: Secondary | ICD-10-CM

## 2021-10-03 DIAGNOSIS — M79675 Pain in left toe(s): Secondary | ICD-10-CM | POA: Diagnosis not present

## 2021-10-03 DIAGNOSIS — M79674 Pain in right toe(s): Secondary | ICD-10-CM

## 2021-10-07 NOTE — Progress Notes (Signed)
Subjective: Linda Mathis is a 70 y.o. female patient seen today for follow up of  painful thick toenails that are difficult to trim. Pain interferes with ambulation. Aggravating factors include wearing enclosed shoe gear. Pain is relieved with periodic professional debridement.  New problems reported today: None.  Patient states she will be going on a cruise for vacation.  PCP is [name], MD. Last visit was: one month ago.  No Known Allergies  Objective: Physical Exam  General: Patient is a pleasant 70 y.o. Caucasian female obese in NAD. AAO x 3.   Neurovascular Examination: CFT <3 seconds b/l LE. Palpable DP/PT pulses b/l LE. Digital hair sparse b/l. Skin temperature gradient WNL b/l. No pain with calf compression b/l. No edema noted b/l. No cyanosis or clubbing noted b/l LE.  Protective sensation intact 5/5 intact bilaterally with 10g monofilament b/l.  Dermatological:  Pedal integument with normal turgor, texture and tone b/l LE. No open wounds b/l. No interdigital macerations b/l. Toenails 1-5 b/l elongated, thickened, discolored with subungual debris. +Tenderness with dorsal palpation of nailplates. No hyperkeratotic or porokeratotic lesions present.  Musculoskeletal:  Normal muscle strength 5/5 to all lower extremity muscle groups bilaterally. HAV with bunion deformity noted b/l LE.Marland Kitchen No pain, crepitus or joint limitation noted with ROM b/l LE.  Patient ambulates independently without assistive aids.  Assessment: 1. Pain due to onychomycosis of toenails of both feet    Plan: Patient was evaluated and treated and all questions answered. Consent given for treatment as described below: -Patient to continue soft, supportive shoe gear daily. -Mycotic toenails 1-5 bilaterally were debrided in length and girth with sterile nail nippers and dremel without incident. -Patient/POA to call should there be question/concern in the interim.  Return in about 3 months (around  01/01/2022).  Freddie Breech, DPM

## 2021-12-13 IMAGING — CR DG TIBIA/FIBULA 2V*L*
2 series · 2 of 2 positions shown · non-contrast
Comparison: None.

CLINICAL DATA: Wound infection.

EXAM:
LEFT TIBIA AND FIBULA - 2 VIEW

[tibia ap]
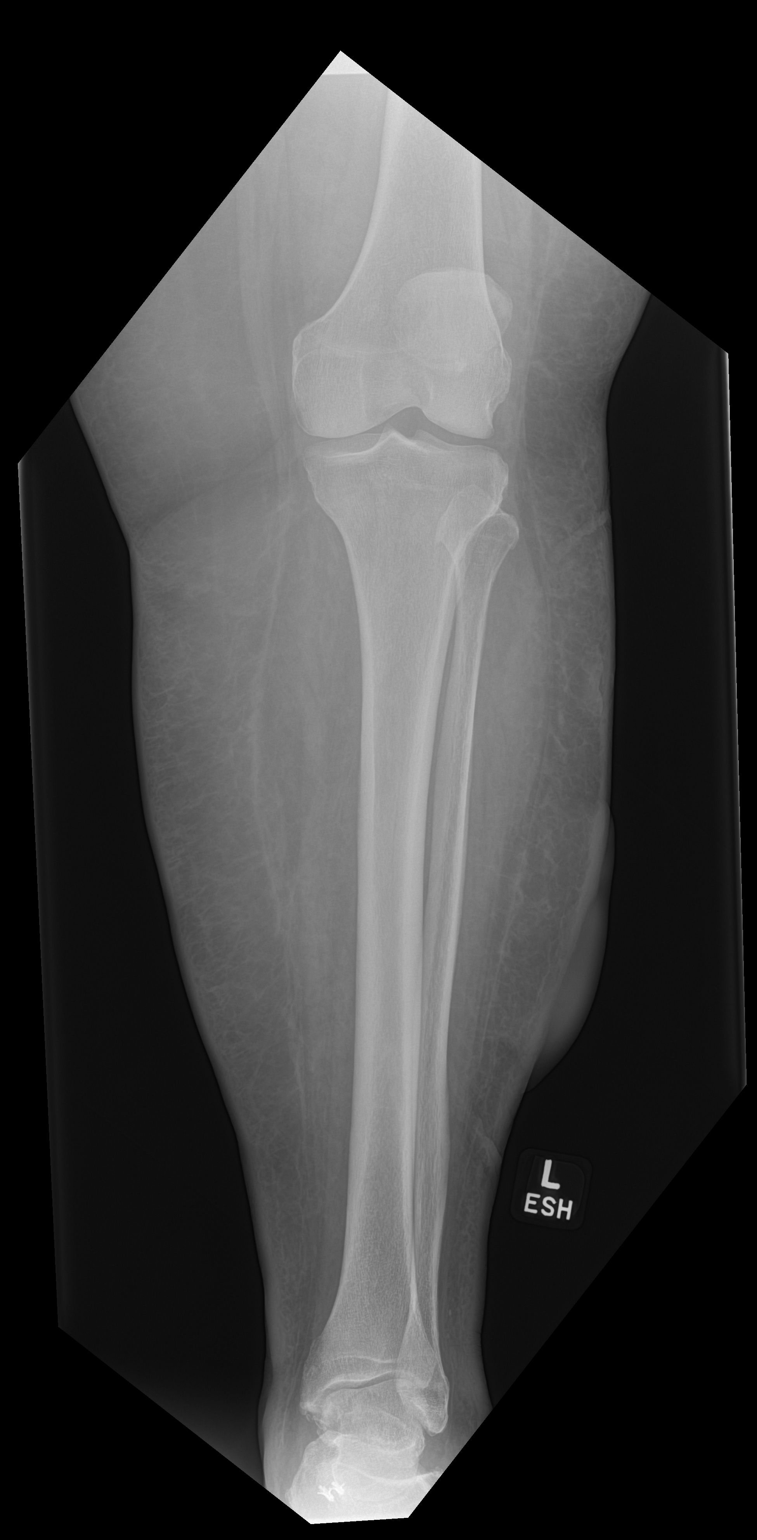

[tibia lat]
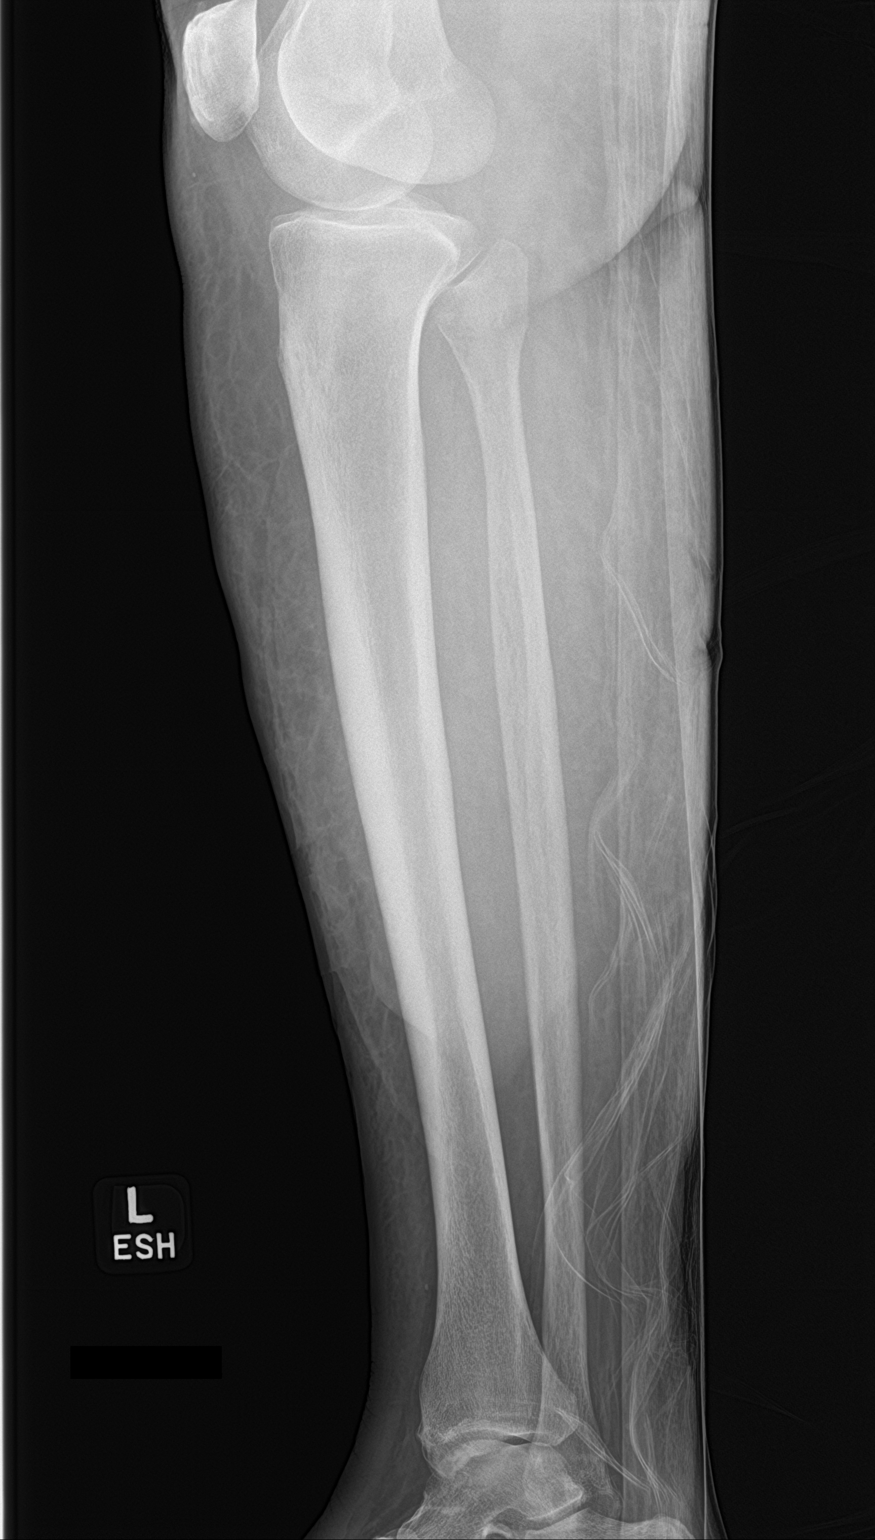

[2 of 2 positions shown; findings below may reference images not displayed]

FINDINGS: There is no evidence of fracture or other focal bone lesions. Soft
tissues are unremarkable.
IMPRESSION: Negative.

## 2021-12-24 ENCOUNTER — Ambulatory Visit: Payer: Medicare HMO | Admitting: Family

## 2021-12-24 ENCOUNTER — Encounter: Payer: Self-pay | Admitting: Family

## 2021-12-24 DIAGNOSIS — M25562 Pain in left knee: Secondary | ICD-10-CM

## 2021-12-24 DIAGNOSIS — L03116 Cellulitis of left lower limb: Secondary | ICD-10-CM

## 2021-12-24 MED ORDER — METHYLPREDNISOLONE ACETATE 40 MG/ML IJ SUSP
40.0000 mg | INTRAMUSCULAR | Status: AC | PRN
  Administered 2021-12-24: 40 mg via INTRA_ARTICULAR

## 2021-12-24 MED ORDER — CEPHALEXIN 500 MG PO CAPS: 500.0000 mg | ORAL_CAPSULE | Freq: Three times a day (TID) | ORAL | 0 refills | Status: DC

## 2021-12-24 MED ORDER — LIDOCAINE HCL 1 % IJ SOLN
5.0000 mL | INTRAMUSCULAR | Status: AC | PRN
  Administered 2021-12-24: 5 mL

## 2021-12-24 NOTE — Progress Notes (Signed)
? ?Office Visit Note ?  ?Patient: Linda Mathis           ?Date of Birth: 1950/12/15           ?MRN: [id number] ?Visit Date: 12/24/2021 ?             ?Requested by: [name], MD ?[address] ?[address] ?[place],  [place] [address] ?PCP: [name], MD ? ?No chief complaint on file. ? ? ? ? ?HPI: ?The patient is a 71 year old woman seen for evaluation of left lower leg and knee pain. Pain to medial knee which radiates down the medial shin. Also pain to lateral left leg that moves across her previous healed wound, across anterior shin. Some associated burning.  ? ?This has been ongoing for last 2 months. States the knee pain feels similar to prior episodes associated with a meniscal injury.  ? ?The lateral pain is new but ongoing for 2 months as well. ? ?Associated with erythema and swelling. Is constant. No aggravating or relieving factors. ? ?Does have history of lumbar surgery and sciatica. In pain management. Due to have spinal stimulator 'soon.' ? ?Assessment & Plan: ?Visit Diagnoses: No diagnosis found. ? ?Plan: We will trial a Depo-Medrol injection of the left knee.  Keflex for cellulitis of the left lower extremity. ?Concerned that some of her symptoms may be explained by left lumbar radiculopathy ? ?Follow-Up Instructions: Return if symptoms worsen or fail to improve.  ? ?Left Knee Exam  ? ?Muscle Strength  ?The patient has normal left knee strength. ? ?Tenderness  ?The patient is experiencing tenderness in the lateral joint line and medial joint line. ? ?Range of Motion  ?The patient has normal left knee ROM. ? ?Tests  ?Varus: negative Valgus: negative ?Pivot shift: positive ? ?Other  ?Erythema: absent ?Swelling: none ?Effusion: no effusion present ? ? ?Back Exam  ? ?Tenderness  ?The patient is experiencing tenderness in the lumbar. ? ?Tests  ?Straight leg raise left: positive ? ? ? ? ?Patient is alert, oriented, no adenopathy, well-dressed, normal affect, normal respiratory effort. ? ? ?Imaging: ?No results  found. ?No images are attached to the encounter. ? ?Labs: ?Lab Results  ?Component Value Date  ? HGBA1C 6.2 (H) 04/30/2021  ? ESRSEDRATE 66 (H) 04/30/2021  ? CRP 4 04/30/2021  ? REPTSTATUS 11/29/2020 FINAL 11/24/2020  ? CULT  11/24/2020  ?  NO GROWTH 5 DAYS ?Performed at [facility], [address] [place], [place] [address] ?  ? ? ? ?Lab Results  ?Component Value Date  ? ALBUMIN 4.2 04/30/2021  ? ? ?No results found for: MG ?Lab Results  ?Component Value Date  ? VD25OH 29.4 (L) 04/30/2021  ? ? ?No results found for: PREALBUMIN ?CBC EXTENDED Latest Ref Rng & Units 04/30/2021 11/28/2020 11/27/2020  ?WBC 3.4 - 10.8 x10E3/uL 7.2 5.3 5.1  ?RBC 3.77 - 5.28 x10E6/uL 5.32(H) 3.76(L) 3.98  ?HGB 11.1 - 15.9 g/dL 51.8 10.5(L) 10.7(L)  ?HCT 34.0 - 46.6 % 40.9 32.1(L) 34.4(L)  ?PLT 150 - 450 x10E3/uL 246 210 205  ?NEUTROABS 1.4 - 7.0 x10E3/uL 5.0 - -  ?LYMPHSABS 0.7 - 3.1 x10E3/uL 1.4 - -  ? ? ? ?There is no height or weight on file to calculate BMI. ? ?Orders:  ?No orders of the defined types were placed in this encounter. ? ?No orders of the defined types were placed in this encounter. ? ? ? Procedures: ?Large Joint Inj: L knee on 12/24/2021 10:25 AM ?Indications:  pain ?Details: 18 G 1.5 in needle, anteromedial approach ?Medications: 5 mL lidocaine 1 %; 40 mg methylPREDNISolone acetate 40 MG/ML ?Consent was given by the patient.  ? ? ? ?Clinical Data: ?No additional findings. ? ?ROS: ? ?All other systems negative, except as noted in the HPI. ?Review of Systems  ?Constitutional:  Negative for chills and fever.  ?Musculoskeletal:  Positive for back pain and myalgias.  ?Skin:  Positive for color change. Negative for rash and wound.  ?Neurological:  Negative for weakness and numbness.  ? ?Objective: ?Vital Signs: There were no vitals taken for this visit. ? ?Specialty Comments:  ?No specialty comments available. ? ?PMFS History: ?Patient Active Problem List  ? Diagnosis Date Noted  ? Fall 07/18/2021  ? Left hip pain 07/18/2021   ? Scalp hematoma, initial encounter 07/18/2021  ? Gait abnormality 04/30/2021  ? Chronic bilateral low back pain without sciatica 04/30/2021  ? Abnormal brain CT 04/24/2021  ? Concussion 04/23/2021  ? Laceration of left leg 11/25/2020  ? Wound of left lower extremity 11/24/2020  ? ?Past Medical History:  ?Diagnosis Date  ? CVA (cerebral vascular accident) [facility])   ? Depression   ? Family history of adverse reaction to anesthesia   ? GERD (gastroesophageal reflux disease)   ? HTN (hypertension)   ? Hyperlipidemia   ? Left-sided weakness   ? RLS (restless legs syndrome)   ? Wound of left leg 11/21/2020  ? MULTIPLE STICHES  ?  ?History reviewed. No pertinent family history.  ?Past Surgical History:  ?Procedure Laterality Date  ? ABDOMINAL HYSTERECTOMY    ? ACHILLES TENDON REPAIR    ? complication of postop wound requiring home health wound care  ? CARPAL TUNNEL RELEASE Right   ? CHOLECYSTECTOMY    ? L4-L5 with Coflex device    ? partial nail avulsion medial border hallux Left   ? TONSILLECTOMY    ? ?Social History  ? ?Occupational History  ? Not on file  ?Tobacco Use  ? Smoking status: Every Day  ?  Packs/day: 0.50  ?  Types: Cigarettes  ? Smokeless tobacco: Never  ?Vaping Use  ? Vaping Use: Never used  ?Substance and Sexual Activity  ? Alcohol use: Not Currently  ? Drug use: Never  ? Sexual activity: Not on file  ? ? ? ? ? ?

## 2021-12-31 ENCOUNTER — Ambulatory Visit (INDEPENDENT_AMBULATORY_CARE_PROVIDER_SITE_OTHER): Payer: Medicare HMO | Admitting: Family

## 2021-12-31 ENCOUNTER — Encounter: Payer: Self-pay | Admitting: Family

## 2021-12-31 DIAGNOSIS — M5416 Radiculopathy, lumbar region: Secondary | ICD-10-CM | POA: Diagnosis not present

## 2021-12-31 MED ORDER — PREDNISONE 50 MG PO TABS: ORAL_TABLET | ORAL | 0 refills | Status: DC

## 2021-12-31 NOTE — Progress Notes (Signed)
? ?Office Visit Note ?  ?Patient: Linda Mathis           ?Date of Birth: April 06, 1951           ?MRN: [id number] ?Visit Date: 12/31/2021 ?             ?Requested by: [name], MD ?[address] ?[address] ?[place],  [place] [address] ?PCP: [name], MD ? ?Chief Complaint  ?Patient presents with  ? Left Leg - Follow-up  ? Left Knee - Follow-up  ? ? ? ? ? ?HPI: ?The patient is a 71 year old woman seen today in follow up for pain to left lower leg and knee pain. Pain to medial knee which radiates down the medial shin. Also pain to lateral left leg that moves across her previous healed wound, across anterior shin. Some associated burning.  ? ?This has been ongoing for last 2 months. The lateral pain is new but ongoing for 2 months as well. ? ?Worsening of LE swelling for over a year now Is constant. No aggravating or relieving factors. ? ?Does have history of lumbar surgery and sciatica. In pain management. Due to have spinal stimulator next Thursday ? ?Was placed on Keflex and given a DepoMedrol injection on 12/24/21. Since she has felt marginal improvement ? ?Assessment & Plan: ?Visit Diagnoses: No diagnosis found. ? ?Plan: impression is left lumbar radiculopathy. Offered prednisone course. She is in agreement with plan. ? ?In pain management for spine currently. ? ?Follow-Up Instructions: Return if symptoms worsen or fail to improve.  ? ?Left Knee Exam  ? ?Muscle Strength  ?The patient has normal left knee strength. ? ?Tenderness  ?The patient is experiencing tenderness in the lateral joint line and medial joint line. ? ?Range of Motion  ?The patient has normal left knee ROM. ? ?Tests  ?Varus: negative Valgus: negative ?Pivot shift: positive ? ?Other  ?Erythema: absent ?Swelling: none ?Effusion: no effusion present ? ? ?Back Exam  ? ?Tenderness  ?The patient is experiencing tenderness in the lumbar. ? ?Tests  ?Straight leg raise left: positive ? ? ? ? ?Patient is alert, oriented, no adenopathy, well-dressed, normal  affect, normal respiratory effort. ?Mild edema to BLE. No erythema or warmth. No skin breakdown. ? ?Imaging: ?No results found. ?No images are attached to the encounter. ? ?Labs: ?Lab Results  ?Component Value Date  ? HGBA1C 6.2 (H) 04/30/2021  ? ESRSEDRATE 66 (H) 04/30/2021  ? CRP 4 04/30/2021  ? REPTSTATUS 11/29/2020 FINAL 11/24/2020  ? CULT  11/24/2020  ?  NO GROWTH 5 DAYS ?Performed at [facility], [address], [place], [place] [address] ?  ? ? ? ?Lab Results  ?Component Value Date  ? ALBUMIN 4.2 04/30/2021  ? ? ?No results found for: MG ?Lab Results  ?Component Value Date  ? VD25OH 29.4 (L) 04/30/2021  ? ? ?No results found for: PREALBUMIN ?CBC EXTENDED Latest Ref Rng & Units 04/30/2021 11/28/2020 11/27/2020  ?WBC 3.4 - 10.8 x10E3/uL 7.2 5.3 5.1  ?RBC 3.77 - 5.28 x10E6/uL 5.32(H) 3.76(L) 3.98  ?HGB 11.1 - 15.9 g/dL 81.8 10.5(L) 10.7(L)  ?HCT 34.0 - 46.6 % 40.9 32.1(L) 34.4(L)  ?PLT 150 - 450 x10E3/uL 246 210 205  ?NEUTROABS 1.4 - 7.0 x10E3/uL 5.0 - -  ?LYMPHSABS 0.7 - 3.1 x10E3/uL 1.4 - -  ? ? ? ?There is no height or weight on file to calculate BMI. ? ?Orders:  ?No orders of the defined types were placed in this encounter. ? ?  Meds ordered this encounter  ?Medications  ? predniSONE (DELTASONE) 50 MG tablet  ?  Sig: Take one tablet by mouth once daily for 5 days.  ?  Dispense:  5 tablet  ?  Refill:  0  ? ? ? ? Procedures: ?No procedures performed ? ?Clinical Data: ?No additional findings. ? ?ROS: ? ?All other systems negative, except as noted in the HPI. ?Review of Systems  ?Constitutional:  Negative for chills and fever.  ?Musculoskeletal:  Positive for back pain and myalgias.  ?Skin:  Positive for color change. Negative for rash and wound.  ?Neurological:  Negative for weakness and numbness.  ? ?Objective: ?Vital Signs: There were no vitals taken for this visit. ? ?Specialty Comments:  ?No specialty comments available. ? ?PMFS History: ?Patient Active Problem List  ? Diagnosis Date Noted  ? Fall  07/18/2021  ? Left hip pain 07/18/2021  ? Scalp hematoma, initial encounter 07/18/2021  ? Gait abnormality 04/30/2021  ? Chronic bilateral low back pain without sciatica 04/30/2021  ? Abnormal brain CT 04/24/2021  ? Concussion 04/23/2021  ? Laceration of left leg 11/25/2020  ? Wound of left lower extremity 11/24/2020  ? ?Past Medical History:  ?Diagnosis Date  ? CVA (cerebral vascular accident) [facility])   ? Depression   ? Family history of adverse reaction to anesthesia   ? GERD (gastroesophageal reflux disease)   ? HTN (hypertension)   ? Hyperlipidemia   ? Left-sided weakness   ? RLS (restless legs syndrome)   ? Wound of left leg 11/21/2020  ? MULTIPLE STICHES  ?  ?History reviewed. No pertinent family history.  ?Past Surgical History:  ?Procedure Laterality Date  ? ABDOMINAL HYSTERECTOMY    ? ACHILLES TENDON REPAIR    ? complication of postop wound requiring home health wound care  ? CARPAL TUNNEL RELEASE Right   ? CHOLECYSTECTOMY    ? L4-L5 with Coflex device    ? partial nail avulsion medial border hallux Left   ? TONSILLECTOMY    ? ?Social History  ? ?Occupational History  ? Not on file  ?Tobacco Use  ? Smoking status: Every Day  ?  Packs/day: 0.50  ?  Types: Cigarettes  ? Smokeless tobacco: Never  ?Vaping Use  ? Vaping Use: Never used  ?Substance and Sexual Activity  ? Alcohol use: Not Currently  ? Drug use: Never  ? Sexual activity: Not on file  ? ? ? ? ? ?

## 2022-01-02 ENCOUNTER — Other Ambulatory Visit: Payer: Self-pay

## 2022-01-02 ENCOUNTER — Ambulatory Visit: Payer: Medicare HMO | Admitting: Podiatry

## 2022-01-02 ENCOUNTER — Encounter: Payer: Self-pay | Admitting: Podiatry

## 2022-01-02 ENCOUNTER — Other Ambulatory Visit: Payer: Self-pay | Admitting: *Deleted

## 2022-01-02 DIAGNOSIS — M79674 Pain in right toe(s): Secondary | ICD-10-CM

## 2022-01-02 DIAGNOSIS — B351 Tinea unguium: Secondary | ICD-10-CM

## 2022-01-02 DIAGNOSIS — M79675 Pain in left toe(s): Secondary | ICD-10-CM

## 2022-01-09 ENCOUNTER — Encounter: Payer: Self-pay | Admitting: Podiatry

## 2022-01-09 NOTE — Progress Notes (Signed)
?  Subjective:  ?Patient ID: Linda Mathis, female    DOB: 12-29-1950,  MRN: [id number] ? ?Amari Burnsworth presents to clinic today for painful elongated mycotic toenails 1-5 bilaterally which are tender when wearing enclosed shoe gear. Pain is relieved with periodic professional debridement. ? ?Patient states she went on a cruise and she really enjoyed herself. ? ?New problem(s): None.  ? ?PCP is [name], MD , and last visit was one month ago. ? ?No Known Allergies ? ?Review of Systems: Negative except as noted in the HPI. ? ?Objective: No changes noted in today's physical examination. ?General: Patient is a pleasant 71 y.o. Caucasian female obese in NAD. AAO x 3.  ? ?Neurovascular Examination: ?CFT <3 seconds b/l LE. Palpable DP/PT pulses b/l LE. Digital hair sparse b/l. Skin temperature gradient WNL b/l. No pain with calf compression b/l. No edema noted b/l. No cyanosis or clubbing noted b/l LE. ? ?Protective sensation intact 5/5 intact bilaterally with 10g monofilament b/l. ? ?Dermatological:  ?Pedal integument with normal turgor, texture and tone b/l LE. No open wounds b/l. No interdigital macerations b/l. Toenails 1-5 b/l elongated, thickened, discolored with subungual debris. +Tenderness with dorsal palpation of nailplates. No hyperkeratotic or porokeratotic lesions present. ? ?Musculoskeletal:  ?Normal muscle strength 5/5 to all lower extremity muscle groups bilaterally. HAV with bunion deformity noted b/l LE. No pain, crepitus or joint limitation noted with ROM b/l LE.  Patient ambulates independently without assistive aids. ? ? ?  Latest Ref Rng & Units 04/30/2021  ?  9:26 AM  ?Hemoglobin A1C  ?Hemoglobin-A1c 4.8 - 5.6 % 6.2    ? ?Assessment/Plan: ?1. Pain due to onychomycosis of toenails of both feet   ?  ?-Consent given for treatment as described below: ?-Examined patient. ?-Toenails 1-5 b/l were debrided in length and girth with sterile nail nippers and dremel without iatrogenic bleeding.   ?-Patient/POA to call should there be question/concern in the interim.  ? ?Return in about 3 months (around 04/04/2022). ? ?Freddie Breech, DPM  ?

## 2022-04-03 ENCOUNTER — Other Ambulatory Visit: Payer: Self-pay | Admitting: *Deleted

## 2022-04-03 ENCOUNTER — Ambulatory Visit: Payer: Medicare HMO | Admitting: Podiatry

## 2022-04-03 ENCOUNTER — Encounter: Payer: Self-pay | Admitting: Podiatry

## 2022-04-03 DIAGNOSIS — M79674 Pain in right toe(s): Secondary | ICD-10-CM

## 2022-04-03 DIAGNOSIS — M79675 Pain in left toe(s): Secondary | ICD-10-CM | POA: Diagnosis not present

## 2022-04-03 DIAGNOSIS — B351 Tinea unguium: Secondary | ICD-10-CM

## 2022-04-03 NOTE — Progress Notes (Signed)
  Subjective:  Patient ID: Linda Mathis, female    DOB: 01/23/1951,  MRN: [id number]  Linda Mathis presents to clinic today for painful thick toenails that are difficult to trim. Pain interferes with ambulation. Aggravating factors include wearing enclosed shoe gear. Pain is relieved with periodic professional debridement.  Patient has h/o left leg wound which had healed previously. She currently has cellulitis of the left leg and being treated with antibiotics.  She will be having a pain stimulator implant placed in her back for chronic low back pain/sciatica soon.  New problem(s): None.   PCP is [name], MD.  No Known Allergies  Review of Systems: Negative except as noted in the HPI.  Objective: No changes noted in today's physical examination. General: Patient is a pleasant 71 y.o. Caucasian female obese in NAD. AAO x 3.   Neurovascular Examination: CFT <3 seconds b/l LE. Palpable DP/PT pulses b/l LE. Digital hair sparse b/l. Skin temperature gradient WNL b/l. No pain with calf compression b/l. No edema noted b/l. No cyanosis or clubbing noted b/l LE.  Protective sensation intact 5/5 intact bilaterally with 10g monofilament b/l.  Dermatological:  Pedal integument with normal turgor, texture and tone b/l LE. No open wounds b/l. No interdigital macerations b/l. Toenails 1-5 b/l elongated, thickened, discolored with subungual debris. +Tenderness with dorsal palpation of nailplates. No hyperkeratotic or porokeratotic lesions present.  Musculoskeletal:  Normal muscle strength 5/5 to all lower extremity muscle groups bilaterally. HAV with bunion deformity noted b/l LE. No pain, crepitus or joint limitation noted with ROM b/l LE.  Patient ambulates independently without assistive aids.    Latest Ref Rng & Units 04/30/2021    9:26 AM  Hemoglobin A1C  Hemoglobin-A1c 4.8 - 5.6 % 6.2    Assessment/Plan: 1. Pain due to onychomycosis of toenails of both feet     -Patient was  evaluated and treated. All patient's and/or POA's questions/concerns answered on today's visit. -No new findings. No new orders. -Patient to continue soft, supportive shoe gear daily. -Mycotic toenails 1-5 bilaterally were debrided in length and girth with sterile nail nippers and dremel without incident. -Patient/POA to call should there be question/concern in the interim.   Return in about 3 months (around 07/04/2022).  Freddie Breech, DPM

## 2022-07-03 ENCOUNTER — Encounter: Payer: Self-pay | Admitting: Podiatry

## 2022-07-03 ENCOUNTER — Ambulatory Visit: Payer: Medicare HMO | Admitting: Podiatry

## 2022-07-03 DIAGNOSIS — M79675 Pain in left toe(s): Secondary | ICD-10-CM | POA: Diagnosis not present

## 2022-07-03 DIAGNOSIS — B351 Tinea unguium: Secondary | ICD-10-CM | POA: Diagnosis not present

## 2022-07-03 DIAGNOSIS — M79674 Pain in right toe(s): Secondary | ICD-10-CM

## 2022-07-03 NOTE — Progress Notes (Unsigned)
  Subjective:  Patient ID: Linda Mathis, female    DOB: [birth year],  MRN: [id number]  71 y.o. female presents painful elongated mycotic toenails 1-5 bilaterally which are tender when wearing enclosed shoe gear. Pain is relieved with periodic professional debridement.  New problem(s): None   She states she has had a pain stimulator placed in her back and she feels better. She has several cruises planned in the future.  PCP is [name], MD , and last visit was August, 2023.  No Known Allergies  Review of Systems: Negative except as noted in the HPI.   Objective:  Linda Mathis is a pleasant 71 y.o. female, obese, in NAD. AAO x 3.  Vascular Examination: CFT <3 seconds b/l LE. Palpable pedal pulses b/l LE. Pedal hair sparse. No pain with calf compression b/l. No edema noted b/l LE. No cyanosis or clubbing noted b/l LE.  Neurological Examination: Sensation grossly intact b/l with 10 gram monofilament. Vibratory sensation intact b/l.   Dermatological Examination: Pedal skin with normal turgor, texture and tone b/l. Toenails 1-5 b/l thick, discolored, elongated with subungual debris and pain on dorsal palpation. No hyperkeratotic lesions noted b/l.   Musculoskeletal Examination: Muscle strength 5/5 to b/l LE. HAV with bunion deformity noted b/l LE.  Radiographs: None   Assessment:   1. Pain due to onychomycosis of toenails of both feet    Plan:  -Examined patient. -Patient to continue soft, supportive shoe gear daily. -Toenails 1-5 b/l were debrided in length and girth with sterile nail nippers and dremel without iatrogenic bleeding.  -Patient/POA to call should there be question/concern in the interim.  Return in about 3 months (around 10/02/2022).  Freddie Breech, DPM

## 2022-10-02 ENCOUNTER — Encounter: Payer: Self-pay | Admitting: Podiatry

## 2022-10-02 ENCOUNTER — Ambulatory Visit: Payer: Medicare HMO | Admitting: Podiatry

## 2022-10-02 VITALS — BP 158/88 | HR 70

## 2022-10-02 DIAGNOSIS — M79674 Pain in right toe(s): Secondary | ICD-10-CM

## 2022-10-02 DIAGNOSIS — B351 Tinea unguium: Secondary | ICD-10-CM | POA: Diagnosis not present

## 2022-10-02 DIAGNOSIS — E119 Type 2 diabetes mellitus without complications: Secondary | ICD-10-CM | POA: Diagnosis not present

## 2022-10-02 DIAGNOSIS — M79675 Pain in left toe(s): Secondary | ICD-10-CM | POA: Diagnosis not present

## 2022-10-02 NOTE — Progress Notes (Signed)
  Subjective:  Patient ID: Linda Mathis, female    DOB: [birth year],  MRN: [id number]  Linda Mathis presents to clinic today for preventative diabetic foot care and painful thick toenails that are difficult to trim. Pain interferes with ambulation. Aggravating factors include wearing enclosed shoe gear. Pain is relieved with periodic professional debridement.  Chief Complaint  Patient presents with   Nail Problem    Routine Foot Care   PCP= [name], MD Last Visit=  December 2023   New problem(s): None.   PCP is [name], MD.  No Known Allergies  Review of Systems: Negative except as noted in the HPI.  Objective:   Vitals:   10/02/22 0904  BP: (!) 158/88  Pulse: 70   Linda Mathis is a pleasant 71 y.o. female morbidly obese in NAD. AAO x 3.  Vascular Examination: CFT <3 seconds b/l LE. Palpable pedal pulses b/l LE. Pedal hair sparse. No pain with calf compression b/l. No edema noted b/l LE. No cyanosis or clubbing noted b/l LE.  Neurological Examination: Sensation grossly intact b/l with 10 gram monofilament. Vibratory sensation intact b/l.   Dermatological Examination: Pedal skin with normal turgor, texture and tone b/l. Toenails left great toe and 2-5 b/l thick, discolored, elongated with subungual debris and pain on dorsal palpation. No hyperkeratotic lesions noted b/l.   Anonychia noted R hallux. Nailbed(s) epithelialized.   Musculoskeletal Examination: Muscle strength 5/5 to b/l LE. HAV with bunion deformity noted b/l LE.  Radiographs: None  Assessment/Plan: 1. Pain due to onychomycosis of toenails of both feet   2. Controlled type 2 diabetes mellitus without complication, without long-term current use of insulin (HCC)     No orders of the defined types were placed in this encounter.   -Consent given for treatment as described below: -Examined patient. -Continue foot and shoe inspections daily. Monitor blood glucose per PCP/Endocrinologist's  recommendations. -Mycotic toenails 2-5 bilaterally and left great toe were debrided in length and girth with sterile nail nippers and dremel without iatrogenic bleeding. -Patient/POA to call should there be question/concern in the interim.   Return in about 3 months (around 01/01/2023).  Freddie Breech, DPM

## 2022-12-19 DEATH — deceased

## 2023-01-01 ENCOUNTER — Ambulatory Visit: Payer: Medicare HMO | Admitting: Podiatry

## 2023-04-09 ENCOUNTER — Ambulatory Visit: Payer: Medicare HMO | Admitting: Podiatry
